# Patient Record
Sex: Female | Born: 1985 | Race: Black or African American | Hispanic: No | Marital: Single | Smoking: Current every day smoker
Health system: Southern US, Community
[De-identification: ages and names within clinical notes are randomized; demographics above are authoritative.]

## PROBLEM LIST (undated history)

## (undated) ENCOUNTER — Inpatient Hospital Stay (HOSPITAL_COMMUNITY): Payer: Self-pay

## (undated) DIAGNOSIS — R87629 Unspecified abnormal cytological findings in specimens from vagina: Secondary | ICD-10-CM

## (undated) DIAGNOSIS — A749 Chlamydial infection, unspecified: Secondary | ICD-10-CM

## (undated) DIAGNOSIS — R519 Headache, unspecified: Secondary | ICD-10-CM

## (undated) DIAGNOSIS — R51 Headache: Secondary | ICD-10-CM

## (undated) DIAGNOSIS — IMO0002 Reserved for concepts with insufficient information to code with codable children: Secondary | ICD-10-CM

## (undated) DIAGNOSIS — A549 Gonococcal infection, unspecified: Secondary | ICD-10-CM

## (undated) HISTORY — PX: COLPOSCOPY: SHX161

## (undated) HISTORY — PX: DILATION AND CURETTAGE OF UTERUS: SHX78

## (undated) HISTORY — PX: THERAPEUTIC ABORTION: SHX798

---

## 2006-09-30 ENCOUNTER — Emergency Department (HOSPITAL_COMMUNITY): Admission: EM | Admit: 2006-09-30 | Discharge: 2006-09-30 | Payer: Self-pay | Admitting: Emergency Medicine

## 2007-08-14 ENCOUNTER — Emergency Department (HOSPITAL_COMMUNITY): Admission: EM | Admit: 2007-08-14 | Discharge: 2007-08-14 | Payer: Self-pay | Admitting: Emergency Medicine

## 2007-12-31 ENCOUNTER — Emergency Department (HOSPITAL_COMMUNITY): Admission: EM | Admit: 2007-12-31 | Discharge: 2008-01-01 | Payer: Self-pay | Admitting: Emergency Medicine

## 2008-01-15 ENCOUNTER — Emergency Department (HOSPITAL_COMMUNITY): Admission: EM | Admit: 2008-01-15 | Discharge: 2008-01-16 | Payer: Self-pay | Admitting: Emergency Medicine

## 2008-02-11 ENCOUNTER — Inpatient Hospital Stay (HOSPITAL_COMMUNITY): Admission: AD | Admit: 2008-02-11 | Discharge: 2008-02-11 | Payer: Self-pay | Admitting: Obstetrics & Gynecology

## 2008-03-28 ENCOUNTER — Inpatient Hospital Stay (HOSPITAL_COMMUNITY): Admission: AD | Admit: 2008-03-28 | Discharge: 2008-03-28 | Payer: Self-pay | Admitting: Obstetrics & Gynecology

## 2008-04-02 ENCOUNTER — Inpatient Hospital Stay (HOSPITAL_COMMUNITY): Admission: AD | Admit: 2008-04-02 | Discharge: 2008-04-02 | Payer: Self-pay | Admitting: Obstetrics & Gynecology

## 2009-03-07 ENCOUNTER — Emergency Department (HOSPITAL_COMMUNITY): Admission: EM | Admit: 2009-03-07 | Discharge: 2009-03-07 | Payer: Self-pay | Admitting: Family Medicine

## 2009-03-21 ENCOUNTER — Emergency Department (HOSPITAL_COMMUNITY): Admission: EM | Admit: 2009-03-21 | Discharge: 2009-03-21 | Payer: Self-pay | Admitting: Emergency Medicine

## 2009-04-09 ENCOUNTER — Emergency Department (HOSPITAL_COMMUNITY): Admission: EM | Admit: 2009-04-09 | Discharge: 2009-04-09 | Payer: Self-pay | Admitting: Family Medicine

## 2009-05-23 ENCOUNTER — Emergency Department (HOSPITAL_COMMUNITY): Admission: EM | Admit: 2009-05-23 | Discharge: 2009-05-23 | Payer: Self-pay | Admitting: Family Medicine

## 2009-09-16 ENCOUNTER — Ambulatory Visit: Payer: Self-pay | Admitting: Nurse Practitioner

## 2009-09-16 ENCOUNTER — Inpatient Hospital Stay (HOSPITAL_COMMUNITY): Admission: AD | Admit: 2009-09-16 | Discharge: 2009-09-16 | Payer: Self-pay | Admitting: Family Medicine

## 2009-10-08 ENCOUNTER — Emergency Department (HOSPITAL_COMMUNITY)
Admission: EM | Admit: 2009-10-08 | Discharge: 2009-10-09 | Payer: Self-pay | Source: Home / Self Care | Admitting: Emergency Medicine

## 2010-03-19 LAB — CBC
HCT: 34 % — ABNORMAL LOW (ref 36.0–46.0)
MCH: 31.9 pg (ref 26.0–34.0)
MCV: 92.1 fL (ref 78.0–100.0)
Platelets: 214 10*3/uL (ref 150–400)
RDW: 13 % (ref 11.5–15.5)

## 2010-03-19 LAB — POCT PREGNANCY, URINE: Preg Test, Ur: POSITIVE

## 2010-03-19 LAB — URINALYSIS, ROUTINE W REFLEX MICROSCOPIC
Ketones, ur: NEGATIVE mg/dL
Nitrite: NEGATIVE
Protein, ur: NEGATIVE mg/dL

## 2010-03-19 LAB — GC/CHLAMYDIA PROBE AMP, GENITAL
Chlamydia, DNA Probe: NEGATIVE
GC Probe Amp, Genital: NEGATIVE

## 2010-03-19 LAB — URINE CULTURE

## 2010-03-19 LAB — WET PREP, GENITAL: Trich, Wet Prep: NONE SEEN

## 2010-03-25 LAB — POCT PREGNANCY, URINE: Preg Test, Ur: NEGATIVE

## 2010-03-29 LAB — POCT URINALYSIS DIP (DEVICE)
Glucose, UA: NEGATIVE mg/dL
Ketones, ur: 40 mg/dL — AB
Nitrite: POSITIVE — AB
pH: 6 (ref 5.0–8.0)

## 2010-03-29 LAB — POCT PREGNANCY, URINE: Preg Test, Ur: NEGATIVE

## 2010-03-29 LAB — URINE CULTURE: Colony Count: >=100000

## 2010-04-16 LAB — CBC
HCT: 36.6 % (ref 36.0–46.0)
Hemoglobin: 12.2 g/dL (ref 12.0–15.0)
MCHC: 33.3 g/dL (ref 30.0–36.0)
MCHC: 33.6 g/dL (ref 30.0–36.0)
MCV: 91.7 fL (ref 78.0–100.0)
MCV: 93.7 fL (ref 78.0–100.0)
Platelets: 197 10*3/uL (ref 150–400)
RBC: 3.91 MIL/uL (ref 3.87–5.11)
RDW: 12.5 % (ref 11.5–15.5)
RDW: 13 % (ref 11.5–15.5)

## 2010-04-16 LAB — URINE MICROSCOPIC-ADD ON

## 2010-04-16 LAB — URINALYSIS, ROUTINE W REFLEX MICROSCOPIC
Glucose, UA: NEGATIVE mg/dL
Leukocytes, UA: NEGATIVE
Protein, ur: NEGATIVE mg/dL
Specific Gravity, Urine: 1.015 (ref 1.005–1.030)

## 2010-04-16 LAB — GC/CHLAMYDIA PROBE AMP, GENITAL: GC Probe Amp, Genital: NEGATIVE

## 2010-04-16 LAB — POCT PREGNANCY, URINE: Preg Test, Ur: NEGATIVE

## 2010-04-20 LAB — URINALYSIS, ROUTINE W REFLEX MICROSCOPIC
Bilirubin Urine: NEGATIVE
Hgb urine dipstick: NEGATIVE
Protein, ur: NEGATIVE mg/dL
Urobilinogen, UA: 1 mg/dL (ref 0.0–1.0)

## 2010-04-20 LAB — POCT I-STAT, CHEM 8
Calcium, Ion: 1.16 mmol/L (ref 1.12–1.32)
Creatinine, Ser: 0.7 mg/dL (ref 0.4–1.2)
Glucose, Bld: 87 mg/dL (ref 70–99)
HCT: 33 % — ABNORMAL LOW (ref 36.0–46.0)
Hemoglobin: 11.2 g/dL — ABNORMAL LOW (ref 12.0–15.0)
TCO2: 25 mmol/L (ref 0–100)

## 2010-04-20 LAB — HCG, QUANTITATIVE, PREGNANCY: hCG, Beta Chain, Quant, S: 130588 m[IU]/mL — ABNORMAL HIGH (ref <5)

## 2010-04-20 LAB — URINE MICROSCOPIC-ADD ON

## 2010-04-21 LAB — URINALYSIS, ROUTINE W REFLEX MICROSCOPIC
Hgb urine dipstick: NEGATIVE
Ketones, ur: NEGATIVE mg/dL
Nitrite: NEGATIVE
Protein, ur: NEGATIVE mg/dL
Specific Gravity, Urine: 1.025 (ref 1.005–1.030)
Urobilinogen, UA: 0.2 mg/dL (ref 0.0–1.0)

## 2010-04-21 LAB — CBC
HCT: 32.6 % — ABNORMAL LOW (ref 36.0–46.0)
MCHC: 33.2 g/dL (ref 30.0–36.0)
MCV: 94.2 fL (ref 78.0–100.0)
Platelets: 203 10*3/uL (ref 150–400)
WBC: 7.3 10*3/uL (ref 4.0–10.5)

## 2010-04-21 LAB — WET PREP, GENITAL

## 2010-04-21 LAB — GC/CHLAMYDIA PROBE AMP, GENITAL: Chlamydia, DNA Probe: NEGATIVE

## 2010-07-19 ENCOUNTER — Emergency Department (HOSPITAL_COMMUNITY)
Admission: EM | Admit: 2010-07-19 | Discharge: 2010-07-19 | Disposition: A | Discharge status: Home / Self Care | Payer: Medicaid Other | Attending: Emergency Medicine | Admitting: Emergency Medicine

## 2010-07-19 ENCOUNTER — Emergency Department (HOSPITAL_COMMUNITY): Payer: Medicaid Other

## 2010-07-19 DIAGNOSIS — G43909 Migraine, unspecified, not intractable, without status migrainosus: Secondary | ICD-10-CM | POA: Insufficient documentation

## 2010-07-19 DIAGNOSIS — K089 Disorder of teeth and supporting structures, unspecified: Secondary | ICD-10-CM | POA: Insufficient documentation

## 2010-07-19 DIAGNOSIS — R1110 Vomiting, unspecified: Secondary | ICD-10-CM | POA: Insufficient documentation

## 2010-07-19 DIAGNOSIS — D573 Sickle-cell trait: Secondary | ICD-10-CM | POA: Insufficient documentation

## 2010-08-16 ENCOUNTER — Emergency Department (HOSPITAL_COMMUNITY)
Admission: EM | Admit: 2010-08-16 | Discharge: 2010-08-16 | Disposition: A | Discharge status: Home / Self Care | Payer: Medicaid Other | Attending: Emergency Medicine | Admitting: Emergency Medicine

## 2010-08-16 DIAGNOSIS — N949 Unspecified condition associated with female genital organs and menstrual cycle: Secondary | ICD-10-CM | POA: Insufficient documentation

## 2010-08-16 DIAGNOSIS — R109 Unspecified abdominal pain: Secondary | ICD-10-CM | POA: Insufficient documentation

## 2010-08-16 DIAGNOSIS — R5381 Other malaise: Secondary | ICD-10-CM | POA: Insufficient documentation

## 2010-08-16 DIAGNOSIS — R5383 Other fatigue: Secondary | ICD-10-CM | POA: Insufficient documentation

## 2010-08-16 LAB — POCT I-STAT, CHEM 8
BUN: 8 mg/dL (ref 6–23)
Calcium, Ion: 1.25 mmol/L (ref 1.12–1.32)
Chloride: 102 meq/L (ref 96–112)
Creatinine, Ser: 0.7 mg/dL (ref 0.50–1.10)
Glucose, Bld: 85 mg/dL (ref 70–99)
HCT: 36 % (ref 36.0–46.0)
Hemoglobin: 12.2 g/dL (ref 12.0–15.0)
Potassium: 3.5 meq/L (ref 3.5–5.1)
Sodium: 141 meq/L (ref 135–145)
TCO2: 26 mmol/L (ref 0–100)

## 2010-08-16 LAB — DIFFERENTIAL
Eosinophils Relative: 1 % (ref 0–5)
Lymphocytes Relative: 39 % (ref 12–46)
Lymphs Abs: 3.1 10*3/uL (ref 0.7–4.0)
Monocytes Absolute: 0.7 10*3/uL (ref 0.1–1.0)

## 2010-08-16 LAB — CBC
HCT: 33 % — ABNORMAL LOW (ref 36.0–46.0)
MCHC: 35.5 g/dL (ref 30.0–36.0)
MCV: 85.5 fL (ref 78.0–100.0)
RDW: 12.5 % (ref 11.5–15.5)

## 2010-08-16 LAB — URINALYSIS, ROUTINE W REFLEX MICROSCOPIC
Bilirubin Urine: NEGATIVE
Nitrite: NEGATIVE
Protein, ur: NEGATIVE mg/dL
Urobilinogen, UA: 1 mg/dL (ref 0.0–1.0)

## 2010-08-16 LAB — POCT PREGNANCY, URINE: Preg Test, Ur: NEGATIVE

## 2010-08-16 LAB — URINE MICROSCOPIC-ADD ON

## 2010-10-09 LAB — URINALYSIS, ROUTINE W REFLEX MICROSCOPIC
Glucose, UA: NEGATIVE mg/dL
Protein, ur: NEGATIVE mg/dL
pH: 5.5 (ref 5.0–8.0)

## 2010-10-09 LAB — URINE MICROSCOPIC-ADD ON

## 2010-10-09 LAB — POCT PREGNANCY, URINE: Preg Test, Ur: POSITIVE

## 2010-10-15 LAB — POCT PREGNANCY, URINE
Operator id: 247071
Preg Test, Ur: NEGATIVE

## 2011-01-16 ENCOUNTER — Encounter (HOSPITAL_COMMUNITY): Payer: Self-pay | Admitting: *Deleted

## 2011-01-16 ENCOUNTER — Inpatient Hospital Stay (HOSPITAL_COMMUNITY)
Admission: AD | Admit: 2011-01-16 | Discharge: 2011-01-16 | Disposition: A | Discharge status: Home / Self Care | Payer: Medicaid Other | Source: Ambulatory Visit | Attending: Obstetrics & Gynecology | Admitting: Obstetrics & Gynecology

## 2011-01-16 DIAGNOSIS — A599 Trichomoniasis, unspecified: Secondary | ICD-10-CM

## 2011-01-16 DIAGNOSIS — R109 Unspecified abdominal pain: Secondary | ICD-10-CM | POA: Insufficient documentation

## 2011-01-16 DIAGNOSIS — A5901 Trichomonal vulvovaginitis: Secondary | ICD-10-CM | POA: Insufficient documentation

## 2011-01-16 HISTORY — DX: Chlamydial infection, unspecified: A74.9

## 2011-01-16 HISTORY — DX: Gonococcal infection, unspecified: A54.9

## 2011-01-16 HISTORY — DX: Reserved for concepts with insufficient information to code with codable children: IMO0002

## 2011-01-16 LAB — CBC
HCT: 32.7 % — ABNORMAL LOW (ref 36.0–46.0)
Hemoglobin: 11.7 g/dL — ABNORMAL LOW (ref 12.0–15.0)
MCHC: 35.8 g/dL (ref 30.0–36.0)
RBC: 3.77 MIL/uL — ABNORMAL LOW (ref 3.87–5.11)
WBC: 8.2 10*3/uL (ref 4.0–10.5)

## 2011-01-16 LAB — URINALYSIS, ROUTINE W REFLEX MICROSCOPIC
Bilirubin Urine: NEGATIVE
Ketones, ur: NEGATIVE mg/dL
Nitrite: NEGATIVE
Protein, ur: NEGATIVE mg/dL
Urobilinogen, UA: 0.2 mg/dL (ref 0.0–1.0)
pH: 6 (ref 5.0–8.0)

## 2011-01-16 LAB — URINE MICROSCOPIC-ADD ON

## 2011-01-16 LAB — WET PREP, GENITAL

## 2011-01-16 MED ORDER — ONDANSETRON 4 MG PO TBDP
4.0000 mg | ORAL_TABLET | Freq: Once | ORAL | Status: AC
  Administered 2011-01-16: 4 mg via ORAL
  Filled 2011-01-16: qty 1

## 2011-01-16 MED ORDER — METRONIDAZOLE 500 MG PO TABS: 500.0000 mg | ORAL_TABLET | Freq: Two times a day (BID) | ORAL | Status: DC

## 2011-01-16 NOTE — Progress Notes (Signed)
States periods in Nov and Dec were very light and brown, abnl for her.  Last period on 01/06/11 was very heavy.  Has felt like she may be pregnant due to n/v and tender breasts, but HPT neg.

## 2011-01-16 NOTE — ED Provider Notes (Signed)
History   Pt presents today c/o N&V, tender breasts and lower abd cramping. She states she thinks she could be pregnant. She has also noted "milk" coming from her both breasts if she squeezes them. She denies fever, vag dc, or irritation. She states her menses finished a couple of days ago.  Chief Complaint  Patient presents with  . Breast Pain  . Abdominal Pain  . Emesis   HPI  OB History    Grav Para Term Preterm Abortions TAB SAB Ect Mult Living   4 2 2  2  2   2       Past Medical History  Diagnosis Date  . Abnormal Pap smear   . Gonorrhea   . Chlamydia     Past Surgical History  Procedure Date  . Colposcopy     History reviewed. No pertinent family history.  History  Substance Use Topics  . Smoking status: Never Smoker   . Smokeless tobacco: Never Used  . Alcohol Use: No    Allergies: No Known Allergies  No prescriptions prior to admission    Review of Systems  Constitutional: Negative for fever.  Respiratory: Negative for cough, hemoptysis, sputum production, shortness of breath and wheezing.   Cardiovascular: Negative for chest pain and palpitations.  Gastrointestinal: Positive for nausea, vomiting and abdominal pain. Negative for diarrhea, constipation and blood in stool.  Genitourinary: Negative for dysuria, urgency, frequency, hematuria and flank pain.  Neurological: Negative for dizziness and headaches.  Psychiatric/Behavioral: Negative for depression and suicidal ideas.   Physical Exam   Blood pressure 99/65, pulse 63, temperature 99 F (37.2 C), temperature source Oral, resp. rate 16, height 4\' 11"  (1.499 m), weight 108 lb (48.988 kg), last menstrual period 01/06/2011.  Physical Exam  Nursing note and vitals reviewed. Constitutional: She is oriented to person, place, and time. She appears well-developed and well-nourished. No distress.  HENT:  Head: Normocephalic and atraumatic.  Eyes: EOM are normal. Pupils are equal, round, and reactive to  light.  GI: Soft. She exhibits no distension and no mass. There is no tenderness. There is no rebound and no guarding.  Genitourinary: No bleeding around the vagina. Vaginal discharge found.       Cervix Lg/closed. Uterus NL size and shape. No adnexal masses. Thin, white vag dc present.  Neurological: She is alert and oriented to person, place, and time.  Skin: Skin is warm and dry. She is not diaphoretic.  Psychiatric: She has a normal mood and affect. Her behavior is normal. Judgment and thought content normal.    MAU Course  Procedures  Wet prep done.  Results for orders placed during the hospital encounter of 01/16/11 (from the past 72 hour(s))  URINALYSIS, ROUTINE W REFLEX MICROSCOPIC     Status: Abnormal   Collection Time   01/16/11  6:50 PM      Component Value Range Comment   Color, Urine YELLOW  YELLOW     APPearance CLEAR  CLEAR     Specific Gravity, Urine 1.020  1.005 - 1.030     pH 6.0  5.0 - 8.0     Glucose, UA NEGATIVE  NEGATIVE (mg/dL)    Hgb urine dipstick NEGATIVE  NEGATIVE     Bilirubin Urine NEGATIVE  NEGATIVE     Ketones, ur NEGATIVE  NEGATIVE (mg/dL)    Protein, ur NEGATIVE  NEGATIVE (mg/dL)    Urobilinogen, UA 0.2  0.0 - 1.0 (mg/dL)    Nitrite NEGATIVE  NEGATIVE  Leukocytes, UA SMALL (*) NEGATIVE    URINE MICROSCOPIC-ADD ON     Status: Normal   Collection Time   01/16/11  6:50 PM      Component Value Range Comment   Squamous Epithelial / LPF RARE  RARE     WBC, UA 0-2  <3 (WBC/hpf)   POCT PREGNANCY, URINE     Status: Normal   Collection Time   01/16/11  6:53 PM      Component Value Range Comment   Preg Test, Ur NEGATIVE     WET PREP, GENITAL     Status: Abnormal   Collection Time   01/16/11  7:27 PM      Component Value Range Comment   Yeast, Wet Prep NONE SEEN  NONE SEEN     Trich, Wet Prep RARE (*) NONE SEEN     Clue Cells, Wet Prep FEW (*) NONE SEEN     WBC, Wet Prep HPF POC FEW (*) NONE SEEN  FEW BACTERIA SEEN  CBC     Status: Abnormal    Collection Time   01/16/11  7:29 PM      Component Value Range Comment   WBC 8.2  4.0 - 10.5 (K/uL)    RBC 3.77 (*) 3.87 - 5.11 (MIL/uL)    Hemoglobin 11.7 (*) 12.0 - 15.0 (g/dL)    HCT 16.1 (*) 09.6 - 46.0 (%)    MCV 86.7  78.0 - 100.0 (fL)    MCH 31.0  26.0 - 34.0 (pg)    MCHC 35.8  30.0 - 36.0 (g/dL)    RDW 04.5  40.9 - 81.1 (%)    Platelets 233  150 - 400 (K/uL)      Assessment and Plan  Trichomonas: discussed with pt at length. Will tx with Flagyl. Warned of antabuse reaction. GC/Chlamydia cultures pending.  Clinton Gallant. Shaqueena Mauceri III, DrHSc, MPAS, PA-C  01/16/2011, 7:29 PM   Henrietta Hoover, PA 01/16/11 1952

## 2011-01-16 NOTE — Progress Notes (Signed)
For past couple of weeks loss of appetite, breast tenderness with some mil, abdominal pain with vomiting.LMP 1/0/13

## 2011-01-18 LAB — GC/CHLAMYDIA PROBE AMP, URINE
Chlamydia, Swab/Urine, PCR: NEGATIVE
GC Probe Amp, Urine: NEGATIVE

## 2011-01-23 ENCOUNTER — Encounter (HOSPITAL_COMMUNITY): Payer: Self-pay

## 2011-01-23 ENCOUNTER — Inpatient Hospital Stay (HOSPITAL_COMMUNITY)
Admission: AD | Admit: 2011-01-23 | Discharge: 2011-01-23 | Disposition: A | Discharge status: Home / Self Care | Payer: Medicaid Other | Source: Ambulatory Visit | Attending: Obstetrics and Gynecology | Admitting: Obstetrics and Gynecology

## 2011-01-23 DIAGNOSIS — R112 Nausea with vomiting, unspecified: Secondary | ICD-10-CM | POA: Insufficient documentation

## 2011-01-23 LAB — CBC
MCH: 30.8 pg (ref 26.0–34.0)
MCV: 87.1 fL (ref 78.0–100.0)
Platelets: 218 10*3/uL (ref 150–400)
RBC: 3.73 MIL/uL — ABNORMAL LOW (ref 3.87–5.11)
RDW: 12.6 % (ref 11.5–15.5)
WBC: 8.1 10*3/uL (ref 4.0–10.5)

## 2011-01-23 LAB — AMYLASE: Amylase: 36 U/L (ref 0–105)

## 2011-01-23 LAB — URINALYSIS, ROUTINE W REFLEX MICROSCOPIC
Bilirubin Urine: NEGATIVE
Leukocytes, UA: NEGATIVE
Nitrite: NEGATIVE
Specific Gravity, Urine: 1.015 (ref 1.005–1.030)
Urobilinogen, UA: 0.2 mg/dL (ref 0.0–1.0)
pH: 6.5 (ref 5.0–8.0)

## 2011-01-23 LAB — COMPREHENSIVE METABOLIC PANEL
ALT: 14 U/L (ref 0–35)
AST: 19 U/L (ref 0–37)
Albumin: 3.8 g/dL (ref 3.5–5.2)
CO2: 27 mEq/L (ref 19–32)
Calcium: 9 mg/dL (ref 8.4–10.5)
Chloride: 102 mEq/L (ref 96–112)
Creatinine, Ser: 0.68 mg/dL (ref 0.50–1.10)
Sodium: 136 mEq/L (ref 135–145)

## 2011-01-23 LAB — HCG, QUANTITATIVE, PREGNANCY: hCG, Beta Chain, Quant, S: <1 m[IU]/mL (ref <5)

## 2011-01-23 MED ORDER — ONDANSETRON 8 MG PO TBDP
8.0000 mg | ORAL_TABLET | Freq: Once | ORAL | Status: AC
  Administered 2011-01-23: 8 mg via ORAL
  Filled 2011-01-23: qty 1

## 2011-01-23 MED ORDER — ONDANSETRON 8 MG PO TBDP: 8.0000 mg | ORAL_TABLET | Freq: Three times a day (TID) | ORAL | Status: AC | PRN

## 2011-01-23 NOTE — Progress Notes (Signed)
Patient is here with c/o nausea, headache and abdominal pain for 2 weeks. She states that no home remedy is working for the nausea. She states the nausea is her main concern. She is denies any vaginal bleeding, or discharge. She states that she had irregular on nov. And dec. She thinks that she may be pregnant and will like a serum pregnancy test.

## 2011-01-23 NOTE — ED Provider Notes (Signed)
History     Chief Complaint  Patient presents with  . Headache  . Nausea   HPI  Patient is here with c/o nausea, headache and lower mid pelvic abdominal pain for 2 weeks. Nausea is primary concern due to not being able to eat. She denies any vaginal bleeding, or discharge. She states that LMP was in early [place].  Denies fever, body aches, or chills.  Headache is in the frontal area.  Reports vomiting approximately 4x today.     Past Medical History  Diagnosis Date  . Abnormal Pap smear   . Gonorrhea   . Chlamydia     Past Surgical History  Procedure Date  . Colposcopy   . Dilation and curettage of uterus     tab x2    History reviewed. No pertinent family history.  History  Substance Use Topics  . Smoking status: Current Some Day Smoker    Types: Cigarettes  . Smokeless tobacco: Never Used  . Alcohol Use: No    Allergies: No Known Allergies  Prescriptions prior to admission  Medication Sig Dispense Refill  . metroNIDAZOLE (FLAGYL) 500 MG tablet Take 1 tablet (500 mg total) by mouth 2 (two) times daily.  14 tablet  0    Review of Systems  Constitutional: Negative.   Eyes: Negative.   Respiratory: Negative.   Cardiovascular: Negative.   Gastrointestinal: Positive for nausea, vomiting and abdominal pain. Negative for diarrhea, constipation and blood in stool.  Genitourinary: Negative.   Neurological: Positive for headaches.   Physical Exam   Blood pressure 98/57, pulse 58, temperature 98.9 F (37.2 C), temperature source Oral, resp. rate 20, height 4\' 10"  (1.473 m), weight 52.164 kg (115 lb), last menstrual period 01/06/2011.  Physical Exam  Constitutional: She is oriented to person, place, and time. She appears well-developed and well-nourished. No distress.  HENT:  Head: Normocephalic.  Neck: Normal range of motion. Neck supple.  Cardiovascular: Normal rate, regular rhythm and normal heart sounds.   Respiratory: Effort normal and breath sounds  normal.  GI: Soft. She exhibits no mass. There is tenderness (left sided, mild with palpation) in the left lower quadrant. There is no guarding.  Neurological: She is alert and oriented to person, place, and time. She has normal reflexes.  Skin: Skin is warm and dry.    MAU Course  Procedures  Results for orders placed during the hospital encounter of 01/23/11 (from the past 24 hour(s))  URINALYSIS, ROUTINE W REFLEX MICROSCOPIC     Status: Normal   Collection Time   01/23/11  7:18 PM      Component Value Range   Color, Urine YELLOW  YELLOW    APPearance CLEAR  CLEAR    Specific Gravity, Urine 1.015  1.005 - 1.030    pH 6.5  5.0 - 8.0    Glucose, UA NEGATIVE  NEGATIVE (mg/dL)   Hgb urine dipstick NEGATIVE  NEGATIVE    Bilirubin Urine NEGATIVE  NEGATIVE    Ketones, ur NEGATIVE  NEGATIVE (mg/dL)   Protein, ur NEGATIVE  NEGATIVE (mg/dL)   Urobilinogen, UA 0.2  0.0 - 1.0 (mg/dL)   Nitrite NEGATIVE  NEGATIVE    Leukocytes, UA NEGATIVE  NEGATIVE   POCT PREGNANCY, URINE     Status: Normal   Collection Time   01/23/11  7:32 PM      Component Value Range   Preg Test, Ur NEGATIVE    CBC     Status: Abnormal   Collection Time  01/23/11  8:47 PM      Component Value Range   WBC 8.1  4.0 - 10.5 (K/uL)   RBC 3.73 (*) 3.87 - 5.11 (MIL/uL)   Hemoglobin 11.5 (*) 12.0 - 15.0 (g/dL)   HCT 16.1 (*) 09.6 - 46.0 (%)   MCV 87.1  78.0 - 100.0 (fL)   MCH 30.8  26.0 - 34.0 (pg)   MCHC 35.4  30.0 - 36.0 (g/dL)   RDW 04.5  40.9 - 81.1 (%)   Platelets 218  150 - 400 (K/uL)  COMPREHENSIVE METABOLIC PANEL     Status: Normal   Collection Time   01/23/11  8:47 PM      Component Value Range   Sodium 136  135 - 145 (mEq/L)   Potassium 3.9  3.5 - 5.1 (mEq/L)   Chloride 102  96 - 112 (mEq/L)   CO2 27  19 - 32 (mEq/L)   Glucose, Bld 96  70 - 99 (mg/dL)   BUN 6  6 - 23 (mg/dL)   Creatinine, Ser 9.14  0.50 - 1.10 (mg/dL)   Calcium 9.0  8.4 - 78.2 (mg/dL)   Total Protein 6.5  6.0 - 8.3 (g/dL)   Albumin  3.8  3.5 - 5.2 (g/dL)   AST 19  0 - 37 (U/L)   ALT 14  0 - 35 (U/L)   Alkaline Phosphatase 67  39 - 117 (U/L)   Total Bilirubin 0.3  0.3 - 1.2 (mg/dL)   GFR calc non Af Amer >90  >90 (mL/min)   GFR calc Af Amer >90  >90 (mL/min)  LIPASE, BLOOD     Status: Normal   Collection Time   01/23/11  8:47 PM      Component Value Range   Lipase 20  11 - 59 (U/L)  AMYLASE     Status: Normal   Collection Time   01/23/11  8:47 PM      Component Value Range   Amylase 36  0 - 105 (U/L)  HCG, QUANTITATIVE, PREGNANCY     Status: Normal   Collection Time   01/23/11  8:47 PM      Component Value Range   hCG, Beta Chain, Quant, S <1  <5 (mIU/mL)   Pt reports improvement in symptoms after one dose of Zofran.  Assessment and Plan  Nausea and Vomiting in Adult  Plan: DC home Reassured pt nausea may be related to Flagyl RX Zofran F/U if no improvement in one week  [facility] 01/23/2011, 8:36 PM

## 2011-01-23 NOTE — Progress Notes (Signed)
Pt states, " I've had nausea vomiting  and a headache for over a week.I was here last Sat, and they told me to come back if I wasn't better."

## 2011-01-24 NOTE — ED Provider Notes (Signed)
Agree with above note.  Tracie Garcia 01/24/2011 7:17 AM

## 2011-02-15 ENCOUNTER — Emergency Department (INDEPENDENT_AMBULATORY_CARE_PROVIDER_SITE_OTHER)
Admission: EM | Admit: 2011-02-15 | Discharge: 2011-02-15 | Disposition: A | Discharge status: Home / Self Care | Payer: Medicaid Other | Source: Home / Self Care | Attending: Emergency Medicine | Admitting: Emergency Medicine

## 2011-02-15 ENCOUNTER — Encounter (HOSPITAL_COMMUNITY): Payer: Self-pay

## 2011-02-15 DIAGNOSIS — B9789 Other viral agents as the cause of diseases classified elsewhere: Secondary | ICD-10-CM

## 2011-02-15 DIAGNOSIS — B349 Viral infection, unspecified: Secondary | ICD-10-CM

## 2011-02-15 LAB — POCT PREGNANCY, URINE: Preg Test, Ur: NEGATIVE

## 2011-02-15 LAB — POCT URINALYSIS DIP (DEVICE)
Bilirubin Urine: NEGATIVE
Glucose, UA: NEGATIVE mg/dL
Hgb urine dipstick: NEGATIVE
Ketones, ur: 15 mg/dL — AB
Specific Gravity, Urine: 1.015 (ref 1.005–1.030)

## 2011-02-15 LAB — POCT I-STAT, CHEM 8
BUN: 4 mg/dL — ABNORMAL LOW (ref 6–23)
Calcium, Ion: 1.16 mmol/L (ref 1.12–1.32)
Chloride: 104 mEq/L (ref 96–112)
Creatinine, Ser: 0.9 mg/dL (ref 0.50–1.10)
TCO2: 24 mmol/L (ref 0–100)

## 2011-02-15 LAB — CBC
HCT: 33.7 % — ABNORMAL LOW (ref 36.0–46.0)
Hemoglobin: 12 g/dL (ref 12.0–15.0)
MCV: 86.2 fL (ref 78.0–100.0)
RBC: 3.91 MIL/uL (ref 3.87–5.11)
RDW: 12.5 % (ref 11.5–15.5)
WBC: 15.7 10*3/uL — ABNORMAL HIGH (ref 4.0–10.5)

## 2011-02-15 LAB — DIFFERENTIAL
Basophils Absolute: 0 10*3/uL (ref 0.0–0.1)
Eosinophils Relative: 0 % (ref 0–5)
Lymphocytes Relative: 7 % — ABNORMAL LOW (ref 12–46)
Lymphs Abs: 1 10*3/uL (ref 0.7–4.0)
Monocytes Absolute: 1.3 10*3/uL — ABNORMAL HIGH (ref 0.1–1.0)
Neutro Abs: 13.4 10*3/uL — ABNORMAL HIGH (ref 1.7–7.7)

## 2011-02-15 MED ORDER — ONDANSETRON 4 MG PO TBDP
4.0000 mg | ORAL_TABLET | Freq: Once | ORAL | Status: AC
  Administered 2011-02-15: 4 mg via ORAL

## 2011-02-15 MED ORDER — ACETAMINOPHEN 325 MG PO TABS
975.0000 mg | ORAL_TABLET | Freq: Once | ORAL | Status: AC
  Administered 2011-02-15: 975 mg via ORAL

## 2011-02-15 MED ORDER — GUAIFENESIN-CODEINE 100-10 MG/5ML PO SYRP: 5.0000 mL | ORAL_SOLUTION | Freq: Three times a day (TID) | ORAL | Status: AC | PRN

## 2011-02-15 MED ORDER — ONDANSETRON HCL 4 MG PO TABS: 4.0000 mg | ORAL_TABLET | Freq: Three times a day (TID) | ORAL | Status: AC | PRN

## 2011-02-15 MED ORDER — ACETAMINOPHEN 325 MG PO TABS
ORAL_TABLET | ORAL | Status: AC
  Filled 2011-02-15: qty 3

## 2011-02-15 MED ORDER — HYDROCODONE-ACETAMINOPHEN 5-325 MG PO TABS
ORAL_TABLET | ORAL | Status: AC
  Filled 2011-02-15: qty 1

## 2011-02-15 MED ORDER — GUAIFENESIN-CODEINE 100-10 MG/5ML PO SYRP: 5.0000 mL | ORAL_SOLUTION | Freq: Three times a day (TID) | ORAL | Status: DC | PRN

## 2011-02-15 MED ORDER — ONDANSETRON HCL 4 MG PO TABS: 4.0000 mg | ORAL_TABLET | Freq: Three times a day (TID) | ORAL | Status: DC | PRN

## 2011-02-15 MED ORDER — ONDANSETRON 4 MG PO TBDP
ORAL_TABLET | ORAL | Status: AC
  Filled 2011-02-15: qty 1

## 2011-02-15 MED ORDER — ACETAMINOPHEN 325 MG PO TABS
ORAL_TABLET | ORAL | Status: AC
  Filled 2011-02-15: qty 1

## 2011-02-15 MED ORDER — HYDROCODONE-ACETAMINOPHEN 5-325 MG PO TABS
1.0000 | ORAL_TABLET | Freq: Once | ORAL | Status: AC
  Administered 2011-02-15: 1 via ORAL

## 2011-02-15 NOTE — ED Notes (Signed)
Pt states she started with cough last pm.  States around 3 am today she woke up with n/v, headache, stomach ache and body aches.  Has not taken anything for headache or other sx.

## 2011-02-15 NOTE — ED Provider Notes (Signed)
History     CSN: [id number]  Arrival date & time 02/15/11  1047   First MD Initiated Contact with Patient 02/15/11 1137      Chief Complaint  Patient presents with  . Nausea  . Headache  . Emesis  . Cough    (Consider location/radiation/quality/duration/timing/severity/associated sxs/prior treatment) HPI Comments: Patient presents to urgent care to describing cough and stuffed up nose since last night woke up this morning around 3 AM nausea is and vomited with an ongoing headache and stomach cramps and generalized body aches. Mild cough and stomach pain. Denies any diarrhea does has not tried anything for her symptoms. No household contacts with similar symptoms and feels like she has "the flu"  Patient is a 26 y.o. female presenting with headaches, vomiting, and cough. The history is provided by the patient and the spouse.  Headache The primary symptoms include headaches, fever, nausea and vomiting. Primary symptoms do not include loss of consciousness, altered mental status, dizziness or speech change. The symptoms began 6 to 12 hours ago. The symptoms are worsening.  The headache is not associated with eye pain or neck stiffness.  Additional symptoms include pain and lower back pain. Additional symptoms do not include neck stiffness or hearing loss.  Emesis  Associated symptoms include abdominal pain, cough, a fever and headaches.  Cough Associated symptoms include headaches and rhinorrhea. Pertinent negatives include no shortness of breath.    Past Medical History  Diagnosis Date  . Abnormal Pap smear   . Gonorrhea   . Chlamydia     Past Surgical History  Procedure Date  . Colposcopy   . Dilation and curettage of uterus     tab x2    History reviewed. No pertinent family history.  History  Substance Use Topics  . Smoking status: Current Some Day Smoker    Types: Cigarettes  . Smokeless tobacco: Never Used  . Alcohol Use: No    OB History    Grav Para Term  Preterm Abortions TAB SAB Ect Mult Living   4 2 2  2 2    2       Review of Systems  Constitutional: Positive for fever, diaphoresis, activity change, appetite change and fatigue.  HENT: Positive for congestion and rhinorrhea. Negative for hearing loss, neck pain and neck stiffness.   Eyes: Negative for pain.  Respiratory: Positive for cough. Negative for shortness of breath.   Gastrointestinal: Positive for nausea, vomiting and abdominal pain.  Genitourinary: Negative for dysuria, frequency and pelvic pain.  Skin: Negative for rash.  Neurological: Positive for headaches. Negative for dizziness, speech change and loss of consciousness.  Psychiatric/Behavioral: Negative for altered mental status.    Allergies  Review of patient's allergies indicates no known allergies.  Home Medications  No current outpatient prescriptions on file.  BP 93/64  Pulse 92  Temp(Src) 100.4 F (38 C) (Oral)  Resp 22  SpO2 100%  LMP 01/25/2011  Physical Exam  Nursing note and vitals reviewed. Constitutional: She is oriented to person, place, and time. She appears well-developed.  Non-toxic appearance. She has a sickly appearance. She appears distressed.  HENT:  Head: Normocephalic.  Nose: Rhinorrhea present.  Mouth/Throat: Uvula is midline, oropharynx is clear and moist and mucous membranes are normal. No oropharyngeal exudate.  Eyes: Conjunctivae are normal. No scleral icterus.  Neck: Neck supple. No JVD present. No Kernig's sign noted. No thyromegaly present.  Cardiovascular: Normal rate.   No murmur heard. Pulmonary/Chest: Effort normal and breath sounds  normal. No respiratory distress. She has no decreased breath sounds. She has no wheezes. She has no rhonchi. She has no rales.  Abdominal: Soft. She exhibits no distension. There is no hepatosplenomegaly. There is tenderness in the epigastric area. There is no rigidity, no rebound, no guarding, no CVA tenderness, no tenderness at McBurney's point  and negative Murphy's sign.  Musculoskeletal: She exhibits no tenderness.  Lymphadenopathy:    She has no cervical adenopathy.  Neurological: She is alert and oriented to person, place, and time.  Skin: No rash noted. No erythema.    ED Course  Procedures (including critical care time)  Labs Reviewed  POCT URINALYSIS DIP (DEVICE) - Abnormal; Notable for the following:    Ketones, ur 15 (*)    pH 8.5 (*)    Urobilinogen, UA 2.0 (*)    All other components within normal limits  POCT PREGNANCY, URINE   No results found.   No diagnosis found.    MDM  URI wity N+V < 24 HOURS        Jimmie Molly, MD 02/15/11 1242

## 2011-02-15 NOTE — ED Notes (Signed)
Waiting for patient's ride.  Patient called over 30 min ago, still has not arrived.

## 2011-08-19 ENCOUNTER — Encounter (HOSPITAL_COMMUNITY): Payer: Self-pay

## 2011-08-19 ENCOUNTER — Emergency Department (HOSPITAL_COMMUNITY)
Admission: EM | Admit: 2011-08-19 | Discharge: 2011-08-19 | Disposition: A | Discharge status: Home / Self Care | Payer: Medicaid Other | Attending: Emergency Medicine | Admitting: Emergency Medicine

## 2011-08-19 DIAGNOSIS — R1110 Vomiting, unspecified: Secondary | ICD-10-CM | POA: Insufficient documentation

## 2011-08-19 DIAGNOSIS — T50905A Adverse effect of unspecified drugs, medicaments and biological substances, initial encounter: Secondary | ICD-10-CM

## 2011-08-19 DIAGNOSIS — R51 Headache: Secondary | ICD-10-CM | POA: Insufficient documentation

## 2011-08-19 DIAGNOSIS — T50995A Adverse effect of other drugs, medicaments and biological substances, initial encounter: Secondary | ICD-10-CM | POA: Insufficient documentation

## 2011-08-19 DIAGNOSIS — F17200 Nicotine dependence, unspecified, uncomplicated: Secondary | ICD-10-CM | POA: Insufficient documentation

## 2011-08-19 LAB — BASIC METABOLIC PANEL
CO2: 22 mEq/L (ref 19–32)
Calcium: 8.9 mg/dL (ref 8.4–10.5)
Creatinine, Ser: 0.66 mg/dL (ref 0.50–1.10)
GFR calc non Af Amer: >90 mL/min (ref >90)
Glucose, Bld: 122 mg/dL — ABNORMAL HIGH (ref 70–99)

## 2011-08-19 LAB — CBC WITH DIFFERENTIAL/PLATELET
Eosinophils Absolute: 0 10*3/uL (ref 0.0–0.7)
Eosinophils Relative: 1 % (ref 0–5)
HCT: 35.5 % — ABNORMAL LOW (ref 36.0–46.0)
Lymphocytes Relative: 34 % (ref 12–46)
Lymphs Abs: 3 10*3/uL (ref 0.7–4.0)
MCH: 30.4 pg (ref 26.0–34.0)
MCV: 85.7 fL (ref 78.0–100.0)
Monocytes Absolute: 1.2 10*3/uL — ABNORMAL HIGH (ref 0.1–1.0)
RBC: 4.14 MIL/uL (ref 3.87–5.11)
RDW: 12.3 % (ref 11.5–15.5)
WBC: 8.7 10*3/uL (ref 4.0–10.5)

## 2011-08-19 MED ORDER — SODIUM CHLORIDE 0.9 % IV BOLUS (SEPSIS)
1000.0000 mL | Freq: Once | INTRAVENOUS | Status: AC
  Administered 2011-08-19: 1000 mL via INTRAVENOUS

## 2011-08-19 MED ORDER — ONDANSETRON HCL 4 MG/2ML IJ SOLN
4.0000 mg | Freq: Once | INTRAMUSCULAR | Status: AC
  Administered 2011-08-19: 4 mg via INTRAVENOUS
  Filled 2011-08-19: qty 2

## 2011-08-19 MED ORDER — DIPHENHYDRAMINE HCL 50 MG/ML IJ SOLN
12.5000 mg | Freq: Once | INTRAMUSCULAR | Status: AC
  Administered 2011-08-19: 12.5 mg via INTRAVENOUS
  Filled 2011-08-19: qty 1

## 2011-08-19 MED ORDER — PROCHLORPERAZINE EDISYLATE 5 MG/ML IJ SOLN
10.0000 mg | Freq: Once | INTRAMUSCULAR | Status: AC
  Administered 2011-08-19: 10 mg via INTRAVENOUS
  Filled 2011-08-19: qty 2

## 2011-08-19 NOTE — ED Notes (Signed)
Patient moved to room D34

## 2011-08-19 NOTE — ED Notes (Signed)
Pt ambulated to restroom without assitance 

## 2011-08-19 NOTE — ED Notes (Signed)
Patient discharged home with written and verbal instructions.

## 2011-08-19 NOTE — ED Notes (Signed)
Pt presents with onset of nausea, vomiting after taking Omni Cleansing Gelcaps this morning, going for a jog.  Pt denies any abdominal pain, reports headache.  Pt reports taking medication before without any problems.

## 2011-08-19 NOTE — ED Notes (Signed)
Pt. Is on monitor bp,

## 2011-08-19 NOTE — ED Notes (Signed)
Patient is resting comfortably. 

## 2011-08-19 NOTE — ED Provider Notes (Signed)
History     CSN: [id number]  Arrival date & time 08/19/11  1054   First MD Initiated Contact with Patient 08/19/11 1132      Chief Complaint  Patient presents with  . Emesis    (Consider location/radiation/quality/duration/timing/severity/associated sxs/prior treatment) Patient is a 26 y.o. female presenting with vomiting. The history is provided by the patient.  Emesis  This is a new problem. The current episode started less than 1 hour ago. Episode frequency: "several times" The problem has been gradually worsening. The emesis has an appearance of stomach contents. There has been no fever. Associated symptoms include headaches. Pertinent negatives include no abdominal pain, no chills, no cough, no diarrhea, no fever and no URI. Risk factors: took an herbal vitamin.    Past Medical History  Diagnosis Date  . Abnormal Pap smear   . Gonorrhea   . Chlamydia     Past Surgical History  Procedure Date  . Colposcopy   . Dilation and curettage of uterus     tab x2    No family history on file.  History  Substance Use Topics  . Smoking status: Current Some Day Smoker    Types: Cigarettes  . Smokeless tobacco: Never Used  . Alcohol Use: No    OB History    Grav Para Term Preterm Abortions TAB SAB Ect Mult Living   4 2 2  2 2    2       Review of Systems  Constitutional: Negative for fever and chills.  HENT: Negative for neck pain.   Eyes: Positive for visual disturbance.  Respiratory: Negative for cough and shortness of breath.   Cardiovascular: Negative for chest pain and leg swelling.  Gastrointestinal: Positive for nausea and vomiting. Negative for abdominal pain, diarrhea and constipation.  Genitourinary: Negative for urgency, decreased urine volume, difficulty urinating and menstrual problem.  Musculoskeletal: Negative.   Skin: Negative.   Neurological: Positive for light-headedness and headaches. Negative for weakness and numbness.  Hematological: Negative.     Psychiatric/Behavioral: Negative.   All other systems reviewed and are negative.    Allergies  Review of patient's allergies indicates no known allergies.  Home Medications   Current Outpatient Rx  Name Route Sig Dispense Refill  . ACETAMINOPHEN 500 MG PO TABS Oral Take 500 mg by mouth every 6 (six) hours as needed. For pain.    Marland Kitchen OVER THE COUNTER MEDICATION Oral Take 1 capsule by mouth daily. OMNI BOWEL CLEANSE.      BP 119/60  Pulse 101  Temp 98.2 F (36.8 C) (Oral)  Resp 18  SpO2 100%  Physical Exam  Nursing note and vitals reviewed. Constitutional: She is oriented to person, place, and time. She appears well-developed and well-nourished. No distress.  HENT:  Head: Normocephalic and atraumatic.  Right Ear: External ear normal.  Left Ear: External ear normal.  Nose: Nose normal.  Mouth/Throat: Oropharynx is clear and moist.  Eyes: EOM are normal. Pupils are equal, round, and reactive to light. Right eye exhibits no discharge. Left eye exhibits no discharge.  Neck: Full passive range of motion without pain. Neck supple. No rigidity.  Cardiovascular: Normal rate, regular rhythm, normal heart sounds and intact distal pulses.   Pulmonary/Chest: Effort normal and breath sounds normal. No respiratory distress. She has no wheezes. She has no rales.  Abdominal: Soft. She exhibits no distension. There is no tenderness.  Musculoskeletal: She exhibits no edema.  Neurological: She is alert and oriented to person, place, and time.  She has normal strength. She displays normal reflexes. No cranial nerve deficit or sensory deficit. She exhibits normal muscle tone. GCS eye subscore is 4. GCS verbal subscore is 5. GCS motor subscore is 6.  Skin: Skin is warm and dry. She is not diaphoretic. No pallor.    ED Course  Procedures (including critical care time)  Labs Reviewed  CBC WITH DIFFERENTIAL - Abnormal; Notable for the following:    HCT 35.5 (*)     Monocytes Relative 14 (*)      Monocytes Absolute 1.2 (*)     All other components within normal limits  BASIC METABOLIC PANEL - Abnormal; Notable for the following:    Sodium 134 (*)     Potassium 3.1 (*)     Glucose, Bld 122 (*)     All other components within normal limits  POCT PREGNANCY, URINE   No results found.   1. Headache   2. Vomiting       MDM  26 yo female with acute onset of frontal dull headache, vomiting and nausea after approx 20 min after taking OTC herbal/vitamin that she's only taken once before. Symptoms improved substantially with zofran and headache cocktail, and patient feels her lightheadedness is resolved and headache is very minor. Ambulates well and neuro exam normal. Unlikely to be SAH, sinus venous thrombosis or meningitis. Seems to be from the pill she took this AM. Discussed strict return precautions, including return/worsening of headache, fevers, neck pain, neuro symptoms or vision changes. At this time do not feel she needs CT or LP       Pricilla Loveless, MD 08/19/11 [phone]

## 2011-08-23 NOTE — ED Provider Notes (Signed)
I saw and evaluated the patient, reviewed the resident's note and I agree with the findings and plan. Pt with HA and vomiting after taking medication.  Pt with no medical problems and normal exam.  After supportive care pt feeling much better.  No concern for IC pathology.       Gwyneth Sprout, MD 08/23/11 1331

## 2011-09-14 ENCOUNTER — Inpatient Hospital Stay (HOSPITAL_COMMUNITY)
Admission: AD | Admit: 2011-09-14 | Discharge: 2011-09-14 | Disposition: A | Discharge status: Home / Self Care | Payer: Medicaid Other | Source: Ambulatory Visit | Attending: Obstetrics & Gynecology | Admitting: Obstetrics & Gynecology

## 2011-09-14 ENCOUNTER — Encounter (HOSPITAL_COMMUNITY): Payer: Self-pay

## 2011-09-14 ENCOUNTER — Inpatient Hospital Stay (HOSPITAL_COMMUNITY): Payer: Medicaid Other

## 2011-09-14 DIAGNOSIS — O98819 Other maternal infectious and parasitic diseases complicating pregnancy, unspecified trimester: Secondary | ICD-10-CM | POA: Insufficient documentation

## 2011-09-14 DIAGNOSIS — A599 Trichomoniasis, unspecified: Secondary | ICD-10-CM

## 2011-09-14 DIAGNOSIS — N760 Acute vaginitis: Secondary | ICD-10-CM | POA: Insufficient documentation

## 2011-09-14 DIAGNOSIS — R109 Unspecified abdominal pain: Secondary | ICD-10-CM | POA: Insufficient documentation

## 2011-09-14 DIAGNOSIS — A499 Bacterial infection, unspecified: Secondary | ICD-10-CM | POA: Insufficient documentation

## 2011-09-14 DIAGNOSIS — O26899 Other specified pregnancy related conditions, unspecified trimester: Secondary | ICD-10-CM

## 2011-09-14 DIAGNOSIS — A5901 Trichomonal vulvovaginitis: Secondary | ICD-10-CM | POA: Insufficient documentation

## 2011-09-14 DIAGNOSIS — O2390 Unspecified genitourinary tract infection in pregnancy, unspecified trimester: Secondary | ICD-10-CM | POA: Insufficient documentation

## 2011-09-14 DIAGNOSIS — R51 Headache: Secondary | ICD-10-CM | POA: Insufficient documentation

## 2011-09-14 DIAGNOSIS — B9689 Other specified bacterial agents as the cause of diseases classified elsewhere: Secondary | ICD-10-CM

## 2011-09-14 LAB — CBC
HCT: 33 % — ABNORMAL LOW (ref 36.0–46.0)
Hemoglobin: 11.5 g/dL — ABNORMAL LOW (ref 12.0–15.0)
MCH: 30.4 pg (ref 26.0–34.0)
MCHC: 34.8 g/dL (ref 30.0–36.0)

## 2011-09-14 LAB — URINALYSIS, ROUTINE W REFLEX MICROSCOPIC
Bilirubin Urine: NEGATIVE
Glucose, UA: NEGATIVE mg/dL
Hgb urine dipstick: NEGATIVE
Ketones, ur: NEGATIVE mg/dL
Leukocytes, UA: NEGATIVE
Protein, ur: NEGATIVE mg/dL
pH: 6 (ref 5.0–8.0)

## 2011-09-14 MED ORDER — METRONIDAZOLE 500 MG PO TABS: 500.0000 mg | ORAL_TABLET | Freq: Two times a day (BID) | ORAL | Status: DC

## 2011-09-14 NOTE — MAU Note (Signed)
Patient is in with c/o back pain and abdominal pain and headache. Denies any vaginal bleeding or discharge. Ultrasound and bloodwork is done.

## 2011-09-14 NOTE — MAU Note (Signed)
Pt states has been feeling bloated, having side pain, low back pain, intermittent sharp pain on left side only x1week, denies uti s/s, no abnormal vaginal discharge. Keeps feeling cramping. LMP-08/08/2011

## 2011-09-14 NOTE — MAU Provider Note (Signed)
History     CSN: [id number]  Arrival date and time: 09/14/11 1141   First Provider Initiated Contact with Patient 09/14/11 1601      Chief Complaint  Patient presents with  . Abdominal Pain  . Headache   HPI  Pt is [redacted]w[redacted]d pregnant with LMP 08/08/2011 .  Pt has started in her back and then radiating for her abdomen.  The pain comes and goes.  She has not had pain since this morning. She had some cramping like premenstrual cramping.  She took Tylenol this morning with relief.  She denies constipation or diarrhea.  She denies vaginal discharge, itching or burning.  Past Medical History  Diagnosis Date  . Abnormal Pap smear   . Gonorrhea   . Chlamydia     Past Surgical History  Procedure Date  . Colposcopy   . Dilation and curettage of uterus     tab x2    History reviewed. No pertinent family history.  History  Substance Use Topics  . Smoking status: Current Some Day Smoker    Types: Cigarettes  . Smokeless tobacco: Never Used  . Alcohol Use: No    Allergies: No Known Allergies  Prescriptions prior to admission  Medication Sig Dispense Refill  . acetaminophen (TYLENOL) 500 MG tablet Take 500 mg by mouth every 6 (six) hours as needed. For pain.      . Chlorphen-Pseudoephed-APAP (THERAFLU FLU/COLD PO) Take 1 packet by mouth 2 (two) times daily as needed. For cold or flu symtoms        ROS Physical Exam   Blood pressure 107/65, pulse 69, temperature 97.9 F (36.6 C), temperature source Oral, resp. rate 18, height 4' 10.5" (1.486 m), weight 53.128 kg (117 lb 2 oz), last menstrual period 08/08/2011.  Physical Exam  Vitals reviewed. Constitutional: She is oriented to person, place, and time. She appears well-developed and well-nourished.  HENT:  Head: Normocephalic.  Eyes: Pupils are equal, round, and reactive to light.  Neck: Normal range of motion. Neck supple.  Cardiovascular: Normal rate.   Respiratory: Effort normal.  GI: Soft. She exhibits no distension.  There is no tenderness. There is no rebound and no guarding.  Genitourinary:       Mod amount of white frothy discharge in vault; cervix clean NT; uterus NSSC NT; without adnexal tenderness or palpable enlargemnt  Musculoskeletal: Normal range of motion.  Neurological: She is alert and oriented to person, place, and time.  Skin: Skin is warm and dry.  Psychiatric: She has a normal mood and affect.    MAU Course  Procedures  Results for orders placed during the hospital encounter of 09/14/11 (from the past 24 hour(s))  URINALYSIS, ROUTINE W REFLEX MICROSCOPIC     Status: Abnormal   Collection Time   09/14/11 12:45 PM      Component Value Range   Color, Urine YELLOW  YELLOW   APPearance CLEAR  CLEAR   Specific Gravity, Urine 1.015  1.005 - 1.030   pH 6.0  5.0 - 8.0   Glucose, UA NEGATIVE  NEGATIVE mg/dL   Hgb urine dipstick NEGATIVE  NEGATIVE   Bilirubin Urine NEGATIVE  NEGATIVE   Ketones, ur NEGATIVE  NEGATIVE mg/dL   Protein, ur NEGATIVE  NEGATIVE mg/dL   Urobilinogen, UA 2.0 (*) 0.0 - 1.0 mg/dL   Nitrite NEGATIVE  NEGATIVE   Leukocytes, UA NEGATIVE  NEGATIVE  POCT PREGNANCY, URINE     Status: Abnormal   Collection Time   09/14/11 12:54  PM      Component Value Range   Preg Test, Ur POSITIVE (*) NEGATIVE  HCG, QUANTITATIVE, PREGNANCY     Status: Abnormal   Collection Time   09/14/11  1:32 PM      Component Value Range   hCG, Beta Chain, Quant, S 10791 (*) <5 mIU/mL  CBC     Status: Abnormal   Collection Time   09/14/11  2:42 PM      Component Value Range   WBC 6.6  4.0 - 10.5 K/uL   RBC 3.78 (*) 3.87 - 5.11 MIL/uL   Hemoglobin 11.5 (*) 12.0 - 15.0 g/dL   HCT 40.9 (*) 81.1 - 91.4 %   MCV 87.3  78.0 - 100.0 fL   MCH 30.4  26.0 - 34.0 pg   MCHC 34.8  30.0 - 36.0 g/dL   RDW 78.2  95.6 - 21.3 %   Platelets 227  150 - 400 K/uL  WET PREP, GENITAL     Status: Abnormal   Collection Time   09/14/11  4:20 PM      Component Value Range   Yeast Wet Prep HPF POC NONE SEEN  NONE  SEEN   Trich, Wet Prep FEW (*) NONE SEEN   Clue Cells Wet Prep HPF POC MANY (*) NONE SEEN   WBC, Wet Prep HPF POC FEW (*) NONE SEEN     Assessment and Plan  Abdominal pain in pregnancy Trichomonas and bacterial vaginosis- Flagyl 500mg  BID for 7 days; partner to be treated F/u with OB care- confirmation of pregnancy letter  Pamelia Hoit 09/14/2011, 4:05 PM

## 2011-09-15 LAB — GC/CHLAMYDIA PROBE AMP, GENITAL
Chlamydia, DNA Probe: NEGATIVE
GC Probe Amp, Genital: NEGATIVE

## 2011-09-17 ENCOUNTER — Telehealth (HOSPITAL_COMMUNITY): Payer: Self-pay | Admitting: Gynecology

## 2011-09-17 ENCOUNTER — Other Ambulatory Visit: Payer: Self-pay | Admitting: Advanced Practice Midwife

## 2011-09-17 MED ORDER — METRONIDAZOLE 500 MG PO TABS: 500.0000 mg | ORAL_TABLET | Freq: Two times a day (BID) | ORAL | Status: AC

## 2011-09-17 NOTE — Telephone Encounter (Signed)
E-prescription by Wynelle Bourgeois, CNM  Dorathy Kinsman, CNM 09/17/2011 7:20 PM
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
## 2011-09-18 NOTE — Telephone Encounter (Signed)
[facility] called and [facility] another [facility] for Flagyl 500mg  one BID for 7 days#14 called to [facility] [phone] Also pt sounds very congested and pt states that she cannot sleep due to congestion Pt is also nauseated and no appetite- discussed taking Mucinex as well as saline sprays Claritin 10 mg one QD #30 RF prn Zofran 8mg  ODT #20 RF x 3 Phenergan 25mg  #20 RF x2 Called to [facility] as well.

## 2011-09-26 ENCOUNTER — Inpatient Hospital Stay (HOSPITAL_COMMUNITY)
Admission: AD | Admit: 2011-09-26 | Discharge: 2011-09-27 | Disposition: A | Discharge status: Home / Self Care | Payer: Medicaid Other | Source: Ambulatory Visit | Attending: Obstetrics and Gynecology | Admitting: Obstetrics and Gynecology

## 2011-09-26 ENCOUNTER — Encounter (HOSPITAL_COMMUNITY): Payer: Self-pay | Admitting: *Deleted

## 2011-09-26 DIAGNOSIS — O210 Mild hyperemesis gravidarum: Secondary | ICD-10-CM | POA: Insufficient documentation

## 2011-09-26 DIAGNOSIS — R1032 Left lower quadrant pain: Secondary | ICD-10-CM | POA: Insufficient documentation

## 2011-09-26 LAB — COMPREHENSIVE METABOLIC PANEL
ALT: 8 U/L (ref 0–35)
AST: 14 U/L (ref 0–37)
CO2: 24 mEq/L (ref 19–32)
Calcium: 9.5 mg/dL (ref 8.4–10.5)
Chloride: 97 mEq/L (ref 96–112)
GFR calc non Af Amer: >90 mL/min (ref >90)
Sodium: 133 mEq/L — ABNORMAL LOW (ref 135–145)

## 2011-09-26 LAB — URINALYSIS, ROUTINE W REFLEX MICROSCOPIC
Leukocytes, UA: NEGATIVE
Nitrite: NEGATIVE
Specific Gravity, Urine: 1.015 (ref 1.005–1.030)
Urobilinogen, UA: 0.2 mg/dL (ref 0.0–1.0)

## 2011-09-26 LAB — CBC
MCH: 30.5 pg (ref 26.0–34.0)
Platelets: 214 10*3/uL (ref 150–400)
RBC: 3.74 MIL/uL — ABNORMAL LOW (ref 3.87–5.11)
WBC: 8.7 10*3/uL (ref 4.0–10.5)

## 2011-09-26 MED ORDER — FAMOTIDINE IN NACL 20-0.9 MG/50ML-% IV SOLN
20.0000 mg | Freq: Once | INTRAVENOUS | Status: AC
  Administered 2011-09-26: 20 mg via INTRAVENOUS
  Filled 2011-09-26: qty 50

## 2011-09-26 MED ORDER — PROMETHAZINE HCL 25 MG/ML IJ SOLN
12.5000 mg | Freq: Once | INTRAMUSCULAR | Status: AC
  Administered 2011-09-26: 12.5 mg via INTRAVENOUS
  Filled 2011-09-26: qty 1

## 2011-09-26 MED ORDER — LACTATED RINGERS IV BOLUS (SEPSIS)
1000.0000 mL | Freq: Once | INTRAVENOUS | Status: AC
  Administered 2011-09-26: 1000 mL via INTRAVENOUS

## 2011-09-26 NOTE — MAU Note (Signed)
Pt 7wks G5 P2, presents to MAU reporting SOB.  Pt breathing rapidly.  Pt reports N/V x 1 wk, taking Zofran ODT.  Unable to keep anything down.

## 2011-09-26 NOTE — MAU Provider Note (Signed)
History     CSN: [id number]  Arrival date and time: 09/26/11 2249   First Provider Initiated Contact with Patient 09/26/11 2314      Chief Complaint  Patient presents with  . Shortness of Breath  . Emesis During Pregnancy   HPI Tracie Garcia is a 26 y.o. female @ [redacted]w[redacted]d gestation who presents to MAU with nausea and vomiting. The symptoms started 2 weeks ago. Associated symptoms include abdominal pain, light headed, chills and feeling tired. Patient was here 2 weeks ago and given Rx for Zofran ODT but continues to vomit. States she hasn't kept anything down. Ate sausage biscuit and orange juice today and later vomited. On previous visit patient had complete evaluation with labs and ultrasound that showed a 5 week 2 day IUGS with a YS.  The history was provided by the patient and her medical record.  OB History    Grav Para Term Preterm Abortions TAB SAB Ect Mult Living   5 2 2  2 2    2       Past Medical History  Diagnosis Date  . Abnormal Pap smear   . Gonorrhea   . Chlamydia     Past Surgical History  Procedure Date  . Colposcopy   . Dilation and curettage of uterus     tab x2    History reviewed. No pertinent family history.  History  Substance Use Topics  . Smoking status: Current Some Day Smoker    Types: Cigarettes  . Smokeless tobacco: Never Used  . Alcohol Use: No    Allergies: No Known Allergies  Prescriptions prior to admission  Medication Sig Dispense Refill  . acetaminophen (TYLENOL) 500 MG tablet Take 500 mg by mouth every 6 (six) hours as needed. For pain.      . Chlorphen-Pseudoephed-APAP (THERAFLU FLU/COLD PO) Take 1 packet by mouth 2 (two) times daily as needed. For cold or flu symtoms      . metroNIDAZOLE (FLAGYL) 500 MG tablet Take 1 tablet (500 mg total) by mouth 2 (two) times daily.  14 tablet  0  . ondansetron (ZOFRAN-ODT) 8 MG disintegrating tablet Take 8 mg by mouth every 8 (eight) hours as needed.        Review of Systems    Constitutional: Positive for chills and malaise/fatigue. Negative for fever and weight loss.  HENT: Positive for sore throat (from vomiting). Negative for ear pain, nosebleeds, congestion and neck pain.   Eyes: Negative for blurred vision, double vision, photophobia and pain.  Respiratory: Negative for cough, shortness of breath and wheezing.   Cardiovascular: Negative for chest pain, palpitations and leg swelling.  Gastrointestinal: Positive for heartburn, nausea, vomiting and abdominal pain. Negative for diarrhea and constipation.  Genitourinary: Negative for dysuria, urgency and frequency.  Musculoskeletal: Negative for myalgias and back pain.  Skin: Negative for itching and rash.  Neurological: Positive for dizziness and headaches. Negative for sensory change, speech change, seizures and weakness.  Endo/Heme/Allergies: Does not bruise/bleed easily.  Psychiatric/Behavioral: Negative for depression. The patient is not nervous/anxious.    Physical Exam   Blood pressure 96/55, pulse 76, temperature 97.9 F (36.6 C), temperature source Oral, resp. rate 22, height 4\' 10"  (1.473 m), weight 121 lb (54.885 kg), last menstrual period 08/08/2011.  Physical Exam  Nursing note and vitals reviewed. Constitutional: She is oriented to person, place, and time. She appears well-developed and well-nourished. No distress.  HENT:  Head: Normocephalic and atraumatic.  Eyes: EOM are normal.  Neck: Neck  supple.  Cardiovascular: Normal rate.   Respiratory: Effort normal.  GI: Soft. There is tenderness in the left lower quadrant. There is no rigidity, no rebound, no guarding and no CVA tenderness.       Tenderness is mild  Musculoskeletal: Normal range of motion.  Neurological: She is alert and oriented to person, place, and time.  Skin: Skin is warm and dry.  Psychiatric: She has a normal mood and affect. Her behavior is normal. Judgment and thought content normal.   Results for orders placed during  the hospital encounter of 09/26/11 (from the past 24 hour(s))  URINALYSIS, ROUTINE W REFLEX MICROSCOPIC     Status: Normal   Collection Time   09/26/11 11:10 PM      Component Value Range   Color, Urine YELLOW  YELLOW   APPearance CLEAR  CLEAR   Specific Gravity, Urine 1.015  1.005 - 1.030   pH 6.5  5.0 - 8.0   Glucose, UA NEGATIVE  NEGATIVE mg/dL   Hgb urine dipstick NEGATIVE  NEGATIVE   Bilirubin Urine NEGATIVE  NEGATIVE   Ketones, ur NEGATIVE  NEGATIVE mg/dL   Protein, ur NEGATIVE  NEGATIVE mg/dL   Urobilinogen, UA 0.2  0.0 - 1.0 mg/dL   Nitrite NEGATIVE  NEGATIVE   Leukocytes, UA NEGATIVE  NEGATIVE  CBC     Status: Abnormal   Collection Time   09/26/11 11:13 PM      Component Value Range   WBC 8.7  4.0 - 10.5 K/uL   RBC 3.74 (*) 3.87 - 5.11 MIL/uL   Hemoglobin 11.4 (*) 12.0 - 15.0 g/dL   HCT 16.1 (*) 09.6 - 04.5 %   MCV 85.3  78.0 - 100.0 fL   MCH 30.5  26.0 - 34.0 pg   MCHC 35.7  30.0 - 36.0 g/dL   RDW 40.9  81.1 - 91.4 %   Platelets 214  150 - 400 K/uL  COMPREHENSIVE METABOLIC PANEL     Status: Abnormal   Collection Time   09/26/11 11:13 PM      Component Value Range   Sodium 133 (*) 135 - 145 mEq/L   Potassium 3.1 (*) 3.5 - 5.1 mEq/L   Chloride 97  96 - 112 mEq/L   CO2 24  19 - 32 mEq/L   Glucose, Bld 88  70 - 99 mg/dL   BUN 7  6 - 23 mg/dL   Creatinine, Ser 7.82  0.50 - 1.10 mg/dL   Calcium 9.5  8.4 - 95.6 mg/dL   Total Protein 7.0  6.0 - 8.3 g/dL   Albumin 4.1  3.5 - 5.2 g/dL   AST 14  0 - 37 U/L   ALT 8  0 - 35 U/L   Alkaline Phosphatase 52  39 - 117 U/L   Total Bilirubin 0.3  0.3 - 1.2 mg/dL   GFR calc non Af Amer >90  >90 mL/min   GFR calc Af Amer >90  >90 mL/min   Discussed with the patient hyperemesis diet and all questioned fully answered. She will start her prenatal care and return here if any problems arise.  Assessment:  26 y.o. female @ 110w0d gestation with nausea and vomiting   LLQ abdominal pain  Plan:  IV hydration   Phenergan IV   B.R.A.T  diet    Medication List     As of 09/27/2011 12:30 AM    START taking these medications         * ondansetron 8  MG disintegrating tablet   Commonly known as: ZOFRAN-ODT   Take 1 tablet (8 mg total) by mouth every 12 (twelve) hours as needed for nausea.      promethazine 25 MG suppository   Commonly known as: PHENERGAN   Place 1 suppository (25 mg total) rectally every 6 (six) hours as needed for nausea.      ranitidine 150 MG tablet   Commonly known as: ZANTAC   Take 1 tablet (150 mg total) by mouth 2 (two) times daily.     * Notice: This list has 1 medication(s) that are the same as other medications prescribed for you. Read the directions carefully, and ask your doctor or other care provider to review them with you.    CONTINUE taking these medications         acetaminophen 500 MG tablet   Commonly known as: TYLENOL      metroNIDAZOLE 500 MG tablet   Commonly known as: FLAGYL   Take 1 tablet (500 mg total) by mouth 2 (two) times daily.      * ondansetron 8 MG disintegrating tablet   Commonly known as: ZOFRAN-ODT     * Notice: This list has 1 medication(s) that are the same as other medications prescribed for you. Read the directions carefully, and ask your doctor or other care provider to review them with you.    STOP taking these medications         THERAFLU FLU/COLD PO          Where to get your medications    These are the prescriptions that you need to pick up.   You may get these medications from any pharmacy.         ondansetron 8 MG disintegrating tablet   promethazine 25 MG suppository   ranitidine 150 MG tablet           MAU Course  Procedures Informal bedside ultrasound shows an IUP with cardiac activity. Patient and partner able to visualize heart beat.   Alaney Witter, RN, FNP, [facility] 09/26/2011, 11:18 PM

## 2011-09-27 MED ORDER — PROMETHAZINE HCL 25 MG RE SUPP: 25.0000 mg | Freq: Four times a day (QID) | RECTAL | Status: DC | PRN

## 2011-09-27 MED ORDER — RANITIDINE HCL 150 MG PO TABS: 150.0000 mg | ORAL_TABLET | Freq: Two times a day (BID) | ORAL | Status: DC

## 2011-09-27 MED ORDER — ONDANSETRON 8 MG PO TBDP: 8.0000 mg | ORAL_TABLET | Freq: Two times a day (BID) | ORAL | Status: DC | PRN

## 2011-10-05 NOTE — MAU Provider Note (Signed)
Attestation of Attending Supervision of Advanced Practitioner: Evaluation and management procedures were performed by the PA/NP/CNM/OB Fellow under my supervision/collaboration. Chart reviewed and agree with management and plan.  Nathalia Wismer V 10/05/2011 3:36 PM

## 2011-10-19 ENCOUNTER — Encounter (HOSPITAL_COMMUNITY): Payer: Self-pay | Admitting: *Deleted

## 2011-10-19 ENCOUNTER — Emergency Department (INDEPENDENT_AMBULATORY_CARE_PROVIDER_SITE_OTHER)
Admission: EM | Admit: 2011-10-19 | Discharge: 2011-10-19 | Disposition: A | Discharge status: Home / Self Care | Payer: Medicaid Other | Source: Home / Self Care | Attending: Emergency Medicine | Admitting: Emergency Medicine

## 2011-10-19 DIAGNOSIS — R109 Unspecified abdominal pain: Secondary | ICD-10-CM

## 2011-10-19 LAB — WET PREP, GENITAL

## 2011-10-19 MED ORDER — IBUPROFEN 800 MG PO TABS: 800.0000 mg | ORAL_TABLET | Freq: Three times a day (TID) | ORAL | Status: DC

## 2011-10-19 MED ORDER — TRAMADOL HCL 50 MG PO TABS: 50.0000 mg | ORAL_TABLET | Freq: Four times a day (QID) | ORAL | Status: DC | PRN

## 2011-10-19 NOTE — ED Notes (Signed)
C/o abdominal cramping all week ( since Thur.) and spotting S/P abortion 3 weeks ago.

## 2011-10-19 NOTE — ED Provider Notes (Signed)
Medical screening examination/treatment/procedure(s) were performed by a resident physician and as supervising physician I was immediately available for consultation/collaboration.  Additionally, I saw the patient independently, verified the history, examined the patient and discussed the treatment plan with the resident. It appears that this is just typical post abortion pain and bleeding. Her abdomen is benign, and there are no worrisome symptoms. Her pregnancy test is still positive, but this is not unusual, 2-3 weeks out from an abortion.  Leslee Home, M.D.   Reuben Likes, MD 10/19/11 2150

## 2011-10-19 NOTE — ED Provider Notes (Signed)
History     CSN: [id number]  Arrival date & time 10/19/11  1839   First MD Initiated Contact with Patient 10/19/11 1846      Chief Complaint  Patient presents with  . Abdominal Cramping    (Consider location/radiation/quality/duration/timing/severity/associated sxs/prior treatment) HPI Elective abortion 3 weeks ago.  Had heavy bleeding for 1 week then lightened.  Now just spotting.  Has also continued to have abd cramping that is not responding to tylenol.  It is sometimes causing problems with sleep at night.  No fevers, chills, nausea, vomiting, diarrhea, dysuria.  Patient is not regularly taking NSAIDs.  No upper abd pain and no decreased appetite.  Past Medical History  Diagnosis Date  . Abnormal Pap smear   . Gonorrhea   . Chlamydia     Past Surgical History  Procedure Date  . Colposcopy   . Dilation and curettage of uterus     tab x2  . Therapeutic abortion     Family History  Problem Relation Age of Onset  . Adopted: Yes    History  Substance Use Topics  . Smoking status: Current Some Day Smoker -- 1.0 packs/day    Types: Cigarettes  . Smokeless tobacco: Never Used  . Alcohol Use: No    OB History    Grav Para Term Preterm Abortions TAB SAB Ect Mult Living   5 2 2  2 2    2       Review of Systems Per HPI  Allergies  Review of patient's allergies indicates no known allergies.  Home Medications   Current Outpatient Rx  Name Route Sig Dispense Refill  . ACETAMINOPHEN 500 MG PO TABS Oral Take 500 mg by mouth every 6 (six) hours as needed. For pain.    . IBUPROFEN 800 MG PO TABS Oral Take 1 tablet (800 mg total) by mouth 3 (three) times daily. 30 tablet 0  . ONDANSETRON 8 MG PO TBDP Oral Take 1 tablet (8 mg total) by mouth every 12 (twelve) hours as needed for nausea. 20 tablet 0  . ONDANSETRON 8 MG PO TBDP Oral Take 8 mg by mouth every 8 (eight) hours as needed.    Marland Kitchen PROMETHAZINE HCL 25 MG RE SUPP Rectal Place 1 suppository (25 mg total) rectally  every 6 (six) hours as needed for nausea. 12 each 0  . RANITIDINE HCL 150 MG PO TABS Oral Take 1 tablet (150 mg total) by mouth 2 (two) times daily. 60 tablet 0  . TRAMADOL HCL 50 MG PO TABS Oral Take 1 tablet (50 mg total) by mouth every 6 (six) hours as needed for pain. 10 tablet 0    LMP 08/08/2011  Breastfeeding? Unknown  Physical Exam Gen: NAD Abd: SNTND Pelvic exam: normal external genitalia, vulva, vagina, cervix, uterus and adnexa.  No CMT, no uterine tenderness.  No bleeding or significant discharge. Uterus is small and firm.   ED Course  Procedures (including critical care time)   Labs Reviewed  WET PREP, GENITAL  GC/CHLAMYDIA PROBE AMP, GENITAL   No results found.   1. Abdominal pain       MDM  Abd cramping and spotting following abortion with no signs of systemic infection or endometritis.  Patient is non-toxic appearing.  This appears to be a normal exam 3 weeks post abortion.  Will treat with NSAIDs and PRN tramadol and instructions to go to MAU or abortion provider if symptoms persist beyond 2-3 weeks from now.  Brent Bulla, MD 10/19/11 2107

## 2011-10-20 ENCOUNTER — Telehealth (HOSPITAL_COMMUNITY): Payer: Self-pay | Admitting: Emergency Medicine

## 2011-10-20 LAB — GC/CHLAMYDIA PROBE AMP, GENITAL: Chlamydia, DNA Probe: NEGATIVE

## 2011-10-20 MED ORDER — METRONIDAZOLE 500 MG PO TABS: 500.0000 mg | ORAL_TABLET | Freq: Two times a day (BID) | ORAL | Status: DC

## 2011-10-20 NOTE — Telephone Encounter (Signed)
Message copied by Reuben Likes on Wed Oct 20, 2011  4:35 PM ------      Message from: Vassie Moselle      Created: Wed Oct 20, 2011  4:10 PM       You co-signed this chart. I don't know when Dr. Louanne Belton will be here again. Do the clue cells need to be treated?      Vassie Moselle      10/20/2011

## 2011-10-20 NOTE — ED Notes (Signed)
The patient's wet prep was positive for clue cells. She was not treated for this during her visit last night.  Reuben Likes, MD 10/20/11 [phone]

## 2011-10-20 NOTE — ED Notes (Signed)
Patient concerned about extra Rx found in her bag when she went to pick her Rx . No documentation of this Rx exists in chart. After checking lab reports, have verified ID and discussed her negative findings, and will call pt back about her wet prep report. Encounter provider not present, so have reviewed reports with Dr Artis Flock, who would like for her to have flagyl 250 #21, take TID; patient states she already has this Rx, and has been advised to complete this Rx as written

## 2011-10-22 ENCOUNTER — Telehealth (HOSPITAL_COMMUNITY): Payer: Self-pay | Admitting: *Deleted

## 2011-10-22 NOTE — ED Notes (Signed)
GC/Chlamydia neg., Wet prep: Many clue cells, mod. WBC's.  Lab reviewed by Dr. Lorenz Coaster and he e-prescribed Flagyl to [facility] at [facility]  Pt. was notified yesterday of results and Rx.

## 2012-04-10 ENCOUNTER — Ambulatory Visit: Payer: Self-pay

## 2012-04-12 ENCOUNTER — Emergency Department (HOSPITAL_COMMUNITY): Payer: Medicaid Other

## 2012-04-12 ENCOUNTER — Emergency Department (HOSPITAL_COMMUNITY)
Admission: EM | Admit: 2012-04-12 | Discharge: 2012-04-13 | Disposition: A | Discharge status: Home / Self Care | Payer: Medicaid Other | Attending: Emergency Medicine | Admitting: Emergency Medicine

## 2012-04-12 ENCOUNTER — Encounter (HOSPITAL_COMMUNITY): Payer: Self-pay | Admitting: Emergency Medicine

## 2012-04-12 DIAGNOSIS — R209 Unspecified disturbances of skin sensation: Secondary | ICD-10-CM | POA: Insufficient documentation

## 2012-04-12 DIAGNOSIS — R5381 Other malaise: Secondary | ICD-10-CM | POA: Insufficient documentation

## 2012-04-12 DIAGNOSIS — M25531 Pain in right wrist: Secondary | ICD-10-CM

## 2012-04-12 DIAGNOSIS — Z8619 Personal history of other infectious and parasitic diseases: Secondary | ICD-10-CM | POA: Insufficient documentation

## 2012-04-12 DIAGNOSIS — M25539 Pain in unspecified wrist: Secondary | ICD-10-CM | POA: Insufficient documentation

## 2012-04-12 DIAGNOSIS — Z79899 Other long term (current) drug therapy: Secondary | ICD-10-CM | POA: Insufficient documentation

## 2012-04-12 DIAGNOSIS — R5383 Other fatigue: Secondary | ICD-10-CM | POA: Insufficient documentation

## 2012-04-12 DIAGNOSIS — F17200 Nicotine dependence, unspecified, uncomplicated: Secondary | ICD-10-CM | POA: Insufficient documentation

## 2012-04-12 NOTE — ED Notes (Signed)
C/o R forearm pain and swelling x 2 weeks.  No known injury.  States pain starts in R shoulder and goes down arm but majority of pain is at R forearm.

## 2012-04-13 LAB — D-DIMER, QUANTITATIVE: D-Dimer, Quant: <0.27 ug/mL-FEU (ref 0.00–0.48)

## 2012-04-13 LAB — CBC WITH DIFFERENTIAL/PLATELET
Basophils Absolute: 0 10*3/uL (ref 0.0–0.1)
HCT: 34.2 % — ABNORMAL LOW (ref 36.0–46.0)
Lymphocytes Relative: 46 % (ref 12–46)
Monocytes Absolute: 0.8 10*3/uL (ref 0.1–1.0)
Neutro Abs: 4.1 10*3/uL (ref 1.7–7.7)
Neutrophils Relative %: 44 % (ref 43–77)
Platelets: 264 10*3/uL (ref 150–400)
RDW: 12.7 % (ref 11.5–15.5)
WBC: 9.2 10*3/uL (ref 4.0–10.5)

## 2012-04-13 LAB — POCT I-STAT, CHEM 8
Chloride: 104 mEq/L (ref 96–112)
HCT: 37 % (ref 36.0–46.0)
Potassium: 3.5 mEq/L (ref 3.5–5.1)
Sodium: 141 mEq/L (ref 135–145)

## 2012-04-13 MED ORDER — HYDROCODONE-ACETAMINOPHEN 5-325 MG PO TABS: 2.0000 | ORAL_TABLET | Freq: Four times a day (QID) | ORAL | Status: DC | PRN

## 2012-04-13 NOTE — ED Provider Notes (Signed)
History     CSN: [id number]  Arrival date & time 04/12/12  2132   First MD Initiated Contact with Patient 04/12/12 2356      Chief Complaint  Patient presents with  . Arm Pain    (Consider location/radiation/quality/duration/timing/severity/associated sxs/prior treatment) HPI Comments: Reports intermittent L wrist swelling/pain Denies injury but has recently restarted using Depo injection. L hand dominant, does not work outside the house , 1 child age 26   Patient is a 27 y.o. female presenting with arm pain. The history is provided by the patient.  Arm Pain This is a recurrent problem. The current episode started more than 1 year ago. The problem occurs intermittently. The problem has been gradually worsening. Associated symptoms include joint swelling, numbness and weakness. Pertinent negatives include no chills, fever or rash. The symptoms are aggravated by exertion. She has tried nothing for the symptoms. The treatment provided no relief.    Past Medical History  Diagnosis Date  . Abnormal Pap smear   . Gonorrhea   . Chlamydia     Past Surgical History  Procedure Laterality Date  . Colposcopy    . Dilation and curettage of uterus      tab x2  . Therapeutic abortion      Family History  Problem Relation Age of Onset  . Adopted: Yes    History  Substance Use Topics  . Smoking status: Current Some Day Smoker -- 1.00 packs/day    Types: Cigarettes  . Smokeless tobacco: Never Used  . Alcohol Use: No    OB History   Grav Para Term Preterm Abortions TAB SAB Ect Mult Living   5 2 2  2 2    2       Review of Systems  Constitutional: Negative for fever, chills and activity change.  Musculoskeletal: Positive for joint swelling.  Skin: Negative for rash and wound.  Neurological: Positive for weakness and numbness.  All other systems reviewed and are negative.    Allergies  Review of patient's allergies indicates no known allergies.  Home Medications    Current Outpatient Rx  Name  Route  Sig  Dispense  Refill  . acetaminophen (TYLENOL) 500 MG tablet   Oral   Take 500 mg by mouth every 6 (six) hours as needed. For pain.         Marland Kitchen HYDROcodone-acetaminophen (NORCO/VICODIN) 5-325 MG per tablet   Oral   Take 2 tablets by mouth every 6 (six) hours as needed for pain.   30 tablet   0   . ibuprofen (ADVIL) 800 MG tablet   Oral   Take 1 tablet (800 mg total) by mouth 3 (three) times daily.   30 tablet   0   . metroNIDAZOLE (FLAGYL) 500 MG tablet   Oral   Take 1 tablet (500 mg total) by mouth 2 (two) times daily.   14 tablet   0   . ondansetron (ZOFRAN ODT) 8 MG disintegrating tablet   Oral   Take 1 tablet (8 mg total) by mouth every 12 (twelve) hours as needed for nausea.   20 tablet   0   . ondansetron (ZOFRAN-ODT) 8 MG disintegrating tablet   Oral   Take 8 mg by mouth every 8 (eight) hours as needed.         . promethazine (PHENERGAN) 25 MG suppository   Rectal   Place 1 suppository (25 mg total) rectally every 6 (six) hours as needed for nausea.   12  each   0   . ranitidine (ZANTAC) 150 MG tablet   Oral   Take 1 tablet (150 mg total) by mouth 2 (two) times daily.   60 tablet   0   . traMADol (ULTRAM) 50 MG tablet   Oral   Take 1 tablet (50 mg total) by mouth every 6 (six) hours as needed for pain.   10 tablet   0     BP 120/84  Pulse 74  Temp(Src) 98.1 F (36.7 C) (Oral)  Resp 20  SpO2 100%  LMP 08/08/2011  Physical Exam  Nursing note and vitals reviewed. Constitutional: She is oriented to person, place, and time. She appears well-developed and well-nourished.  HENT:  Head: Normocephalic.  Eyes: Pupils are equal, round, and reactive to light.  Neck: Normal range of motion.  Cardiovascular: Normal rate and regular rhythm.   Musculoskeletal: She exhibits tenderness. She exhibits no edema.       Right wrist: She exhibits decreased range of motion, tenderness and bony tenderness. She exhibits no  swelling, no effusion, no crepitus, no deformity and no laceration.  Pain with movement in all planes , grasp   Neurological: She is alert and oriented to person, place, and time.  Skin: Skin is warm.    ED Course  Procedures (including critical care time)  Labs Reviewed  CBC WITH DIFFERENTIAL - Abnormal; Notable for the following:    HCT 34.2 (*)    Lymphs Abs 4.2 (*)    All other components within normal limits  POCT I-STAT, CHEM 8 - Abnormal; Notable for the following:    BUN 4 (*)    All other components within normal limits  D-DIMER, QUANTITATIVE   Dg Forearm Right  04/12/2012  *RADIOLOGY REPORT*  Clinical Data: Distal right forearm pain, swelling and numbness.  RIGHT FOREARM - 2 VIEW  Comparison: None.  Findings: There is no evidence of fracture or dislocation.  The radius and ulna appear intact.  Negative ulnar variance is noted. The elbow joint is grossly unremarkable in appearance; no elbow joint effusion is identified.  No significant soft tissue abnormalities are characterized on radiograph.  No radiopaque foreign bodies are seen.  The carpal rows demonstrate normal alignment, and appear grossly intact.  Visualized joint spaces are preserved.  IMPRESSION: No evidence of fracture or dislocation.  No radiopaque foreign bodies seen.   Original Report Authenticated By: Tonia Ghent, M.D.      1. Wrist pain, right       MDM   Within the differential is vascular occlusion will obtain D dimer , CBC and I-stat        Arman Filter, NP 04/13/12 0130

## 2012-04-13 NOTE — ED Provider Notes (Signed)
Medical screening examination/treatment/procedure(s) were performed by non-physician practitioner and as supervising physician I was immediately available for consultation/collaboration.   David H Yao, MD 04/13/12 0602 

## 2012-04-13 NOTE — ED Notes (Signed)
Pt dc to home.   Pt states understanding to dc instructions.  Pt ambulatory to exit without difficulty.  Pt denies need for w/c. 

## 2012-04-13 NOTE — Progress Notes (Signed)
Orthopedic Tech Progress Note Patient Details:  Tracie Garcia 1985-08-22 [id number]  Ortho Devices Type of Ortho Device: Velcro wrist splint   Haskell Flirt 04/13/2012, 1:42 AM

## 2012-06-21 ENCOUNTER — Encounter (HOSPITAL_COMMUNITY): Payer: Self-pay

## 2012-06-21 ENCOUNTER — Inpatient Hospital Stay (HOSPITAL_COMMUNITY)
Admission: AD | Admit: 2012-06-21 | Discharge: 2012-06-21 | Disposition: A | Discharge status: Home / Self Care | Payer: Medicaid Other | Source: Ambulatory Visit | Attending: Obstetrics & Gynecology | Admitting: Obstetrics & Gynecology

## 2012-06-21 DIAGNOSIS — N912 Amenorrhea, unspecified: Secondary | ICD-10-CM | POA: Insufficient documentation

## 2012-06-21 DIAGNOSIS — N644 Mastodynia: Secondary | ICD-10-CM | POA: Insufficient documentation

## 2012-06-21 LAB — URINALYSIS, ROUTINE W REFLEX MICROSCOPIC
Bilirubin Urine: NEGATIVE
Glucose, UA: NEGATIVE mg/dL
Ketones, ur: NEGATIVE mg/dL
pH: 6 (ref 5.0–8.0)

## 2012-06-21 MED ORDER — MEDROXYPROGESTERONE ACETATE 150 MG/ML IM SUSP
150.0000 mg | Freq: Once | INTRAMUSCULAR | Status: AC
  Administered 2012-06-21: 150 mg via INTRAMUSCULAR
  Filled 2012-06-21: qty 1

## 2012-06-21 NOTE — MAU Note (Signed)
Patient will like further confirmation of whether she is pregnant or not. She states that her breast are fuller and tender. She states that she will like to go back on the depo provera if she is not pregnant. She denies vaginal bleeding, abnormal discharge or cramping. She wants blood pregnancy test.

## 2012-06-21 NOTE — MAU Provider Note (Signed)
History     CSN: [id number]  Arrival date and time: 06/21/12 1606   None     Chief Complaint  Patient presents with  . Possible Pregnancy   HPI 27 y.o. [id number] with breast pain and tenderness. Denies breast mass, nipple discharge, skin changes. Reports she missed Depo Provera shot in April, started bleeding right after that, no bleeding since. Patient's last menstrual period was 04/10/2012. Negative UPT at home. UPT negative here. Requests blood pregnancy test. States she would like to restart Depo Provera if not pregnant.   Past Medical History  Diagnosis Date  . Abnormal Pap smear   . Gonorrhea   . Chlamydia     Past Surgical History  Procedure Laterality Date  . Colposcopy    . Dilation and curettage of uterus      tab x2  . Therapeutic abortion      Family History  Problem Relation Age of Onset  . Adopted: Yes    History  Substance Use Topics  . Smoking status: Current Some Day Smoker -- 1.00 packs/day    Types: Cigarettes  . Smokeless tobacco: Never Used  . Alcohol Use: No    Allergies: No Known Allergies  Prescriptions prior to admission  Medication Sig Dispense Refill  . acetaminophen (TYLENOL) 500 MG tablet Take 500 mg by mouth every 6 (six) hours as needed. For pain.      Marland Kitchen HYDROcodone-acetaminophen (NORCO/VICODIN) 5-325 MG per tablet Take 2 tablets by mouth every 6 (six) hours as needed for pain.  30 tablet  0  . ibuprofen (ADVIL) 800 MG tablet Take 1 tablet (800 mg total) by mouth 3 (three) times daily.  30 tablet  0  . metroNIDAZOLE (FLAGYL) 500 MG tablet Take 1 tablet (500 mg total) by mouth 2 (two) times daily.  14 tablet  0  . ondansetron (ZOFRAN ODT) 8 MG disintegrating tablet Take 1 tablet (8 mg total) by mouth every 12 (twelve) hours as needed for nausea.  20 tablet  0  . ondansetron (ZOFRAN-ODT) 8 MG disintegrating tablet Take 8 mg by mouth every 8 (eight) hours as needed.      . promethazine (PHENERGAN) 25 MG suppository Place 1 suppository  (25 mg total) rectally every 6 (six) hours as needed for nausea.  12 each  0  . ranitidine (ZANTAC) 150 MG tablet Take 1 tablet (150 mg total) by mouth 2 (two) times daily.  60 tablet  0  . traMADol (ULTRAM) 50 MG tablet Take 1 tablet (50 mg total) by mouth every 6 (six) hours as needed for pain.  10 tablet  0    Review of Systems  Constitutional: Negative.   Respiratory: Negative.   Cardiovascular: Negative.   Gastrointestinal: Negative for nausea, vomiting, abdominal pain, diarrhea and constipation.  Genitourinary: Negative for dysuria, urgency, frequency, hematuria and flank pain.       Negative for vaginal bleeding, vaginal discharge, dyspareunia  Musculoskeletal: Negative.   Neurological: Negative.   Psychiatric/Behavioral: Negative.    Physical Exam   Blood pressure 117/74, pulse 66, temperature 98.7 F (37.1 C), temperature source Oral, resp. rate 16, height 4\' 11"  (1.499 m), weight 129 lb 6 oz (58.684 kg), last menstrual period 04/10/2012.  Physical Exam  Nursing note and vitals reviewed. Constitutional: She is oriented to person, place, and time. She appears well-developed and well-nourished. No distress.  Cardiovascular: Normal rate.   Respiratory: Effort normal.  Neurological: She is alert and oriented to person, place, and time.  Skin: Skin  is warm and dry.  Psychiatric: She has a normal mood and affect.    MAU Course  Procedures Results for orders placed during the hospital encounter of 06/21/12 (from the past 24 hour(s))  URINALYSIS, ROUTINE W REFLEX MICROSCOPIC     Status: Abnormal   Collection Time    06/21/12  4:51 PM      Result Value Range   Color, Urine YELLOW  YELLOW   APPearance CLEAR  CLEAR   Specific Gravity, Urine 1.025  1.005 - 1.030   pH 6.0  5.0 - 8.0   Glucose, UA NEGATIVE  NEGATIVE mg/dL   Hgb urine dipstick NEGATIVE  NEGATIVE   Bilirubin Urine NEGATIVE  NEGATIVE   Ketones, ur NEGATIVE  NEGATIVE mg/dL   Protein, ur NEGATIVE  NEGATIVE mg/dL    Urobilinogen, UA 2.0 (*) 0.0 - 1.0 mg/dL   Nitrite NEGATIVE  NEGATIVE   Leukocytes, UA NEGATIVE  NEGATIVE  POCT PREGNANCY, URINE     Status: None   Collection Time    06/21/12  5:46 PM      Result Value Range   Preg Test, Ur NEGATIVE  NEGATIVE    Assessment and Plan  Awaiting serum pregnancy test Plan to give Depo Provera tonight if negative Care assumed by Joseph Berkshire, PA  [facility] 06/21/2012, 6:10 PM   2000 - Assumed care from Georges Mouse, CNM  Results for orders placed during the hospital encounter of 06/21/12 (from the past 24 hour(s))  URINALYSIS, ROUTINE W REFLEX MICROSCOPIC     Status: Abnormal   Collection Time    06/21/12  4:51 PM      Result Value Range   Color, Urine YELLOW  YELLOW   APPearance CLEAR  CLEAR   Specific Gravity, Urine 1.025  1.005 - 1.030   pH 6.0  5.0 - 8.0   Glucose, UA NEGATIVE  NEGATIVE mg/dL   Hgb urine dipstick NEGATIVE  NEGATIVE   Bilirubin Urine NEGATIVE  NEGATIVE   Ketones, ur NEGATIVE  NEGATIVE mg/dL   Protein, ur NEGATIVE  NEGATIVE mg/dL   Urobilinogen, UA 2.0 (*) 0.0 - 1.0 mg/dL   Nitrite NEGATIVE  NEGATIVE   Leukocytes, UA NEGATIVE  NEGATIVE  POCT PREGNANCY, URINE     Status: None   Collection Time    06/21/12  5:46 PM      Result Value Range   Preg Test, Ur NEGATIVE  NEGATIVE  HCG, SERUM, QUALITATIVE     Status: None   Collection Time    06/21/12  6:07 PM      Result Value Range   Preg, Serum NEGATIVE  NEGATIVE     MDM Negative UPT and serum hCG today. 150 mg Depo Provera given in MAU  A: Amenorrhea secondary to Depo Provera  P: Discharge home Patient instructed to call [facility] for appointment for next Depo injection within the appropriate time frame Patient may return to MAU as needed  Freddi Starr, PA-C 06/21/2012 8:31 PM

## 2012-06-21 NOTE — MAU Note (Signed)
Pt states was on depo and should have rcvd injection 04/10/2012. Began having bleeding same day. Has taken home upt's that have all been negative. Denies bleeding or abnormal vaginal discharge. Breasts feel tender.

## 2012-07-16 ENCOUNTER — Encounter (HOSPITAL_COMMUNITY): Payer: Self-pay | Admitting: Emergency Medicine

## 2012-07-16 DIAGNOSIS — K644 Residual hemorrhoidal skin tags: Secondary | ICD-10-CM | POA: Insufficient documentation

## 2012-07-16 DIAGNOSIS — Z9889 Other specified postprocedural states: Secondary | ICD-10-CM | POA: Insufficient documentation

## 2012-07-16 DIAGNOSIS — Z8619 Personal history of other infectious and parasitic diseases: Secondary | ICD-10-CM | POA: Insufficient documentation

## 2012-07-16 DIAGNOSIS — F17200 Nicotine dependence, unspecified, uncomplicated: Secondary | ICD-10-CM | POA: Insufficient documentation

## 2012-07-16 NOTE — ED Notes (Signed)
PT. REPORTS HEMORRHOIDS PAIN ONSET TODAY DENIES BLEEDING , UNRELIEVED BY OTC CREAMS .

## 2012-07-17 ENCOUNTER — Emergency Department (HOSPITAL_COMMUNITY)
Admission: EM | Admit: 2012-07-17 | Discharge: 2012-07-17 | Disposition: A | Discharge status: Home / Self Care | Payer: Medicaid Other | Attending: Emergency Medicine | Admitting: Emergency Medicine

## 2012-07-17 DIAGNOSIS — K649 Unspecified hemorrhoids: Secondary | ICD-10-CM

## 2012-07-17 MED ORDER — POLYETHYLENE GLYCOL 3350 17 GM/SCOOP PO POWD: 17.0000 g | Freq: Every day | ORAL | Status: DC

## 2012-07-17 MED ORDER — HYDROCODONE-ACETAMINOPHEN 5-325 MG PO TABS: 1.0000 | ORAL_TABLET | ORAL | Status: DC | PRN

## 2012-07-17 MED ORDER — HYDROCODONE-ACETAMINOPHEN 5-325 MG PO TABS
1.0000 | ORAL_TABLET | Freq: Once | ORAL | Status: AC
  Administered 2012-07-17: 1 via ORAL
  Filled 2012-07-17: qty 1

## 2012-07-17 MED ORDER — LIDOCAINE HCL 2 % EX GEL
Freq: Once | CUTANEOUS | Status: DC
  Filled 2012-07-17: qty 20

## 2012-07-17 NOTE — ED Provider Notes (Signed)
History    CSN: [id number] Arrival date & time 07/16/12  2316  First MD Initiated Contact with Patient 07/17/12 0046     Chief Complaint  Patient presents with  . Hemorrhoids   HPI  History provided by the patient. Patient is a 27 year old female presenting with complaints of severe rectal pain and hemorrhoids. Patient reports having similar symptoms over a year ago from hemorrhoids and once today are similar. They began earlier in the day after a bowel movement. She states, but was soft without any semiconstrained. She has not had any recent constipation or diarrhea. Denies any other heavy lifting or straining that may have led to the hemorrhoids today. Pain has been persistent and very sharp and severe. She has taken Tylenol and prophylactic out any improvement. She also used warm soaks and preparation H. without any additional help with symptoms. She denies any aggravating or alleviating factors. No other associated symptoms. No abdominal pain, nausea or vomiting.   Past Medical History  Diagnosis Date  . Abnormal Pap smear   . Gonorrhea   . Chlamydia    Past Surgical History  Procedure Laterality Date  . Colposcopy    . Dilation and curettage of uterus      tab x2  . Therapeutic abortion     Family History  Problem Relation Age of Onset  . Adopted: Yes   History  Substance Use Topics  . Smoking status: Current Some Day Smoker -- 1.00 packs/day    Types: Cigarettes  . Smokeless tobacco: Never Used  . Alcohol Use: No   OB History   Grav Para Term Preterm Abortions TAB SAB Ect Mult Living   5 2 2  2 2    2      Review of Systems  Constitutional: Negative for fever, chills and diaphoresis.  Gastrointestinal: Positive for rectal pain. Negative for nausea, vomiting, abdominal pain, diarrhea, constipation and blood in stool.  All other systems reviewed and are negative.    Allergies  Review of patient's allergies indicates no known allergies.  Home Medications    Current Outpatient Rx  Name  Route  Sig  Dispense  Refill  . hydrocortisone cream 1 %   Topical   Apply 1 application topically 2 (two) times daily.         Marland Kitchen HYDROcodone-acetaminophen (NORCO) 5-325 MG per tablet   Oral   Take 1 tablet by mouth every 4 (four) hours as needed for pain.   5 tablet   0   . polyethylene glycol powder (GLYCOLAX/MIRALAX) powder   Oral   Take 17 g by mouth daily.   255 g   0    BP 123/73  Temp(Src) 98.1 F (36.7 C) (Oral)  Resp 18  SpO2 99%  LMP 04/10/2012 Physical Exam  Nursing note and vitals reviewed. Constitutional: She is oriented to person, place, and time. She appears well-developed and well-nourished. No distress.  HENT:  Head: Normocephalic.  Cardiovascular: Normal rate and regular rhythm.   Pulmonary/Chest: Effort normal and breath sounds normal. No respiratory distress. She has no wheezes.  Abdominal: Soft. There is no tenderness.  Genitourinary:     Chaperone was present. 2 large tender hemorrhoids the right anal area. No bleeding.  Musculoskeletal: Normal range of motion.  Neurological: She is alert and oriented to person, place, and time.  Skin: Skin is warm and dry. No rash noted.  Psychiatric: She has a normal mood and affect. Her behavior is normal.    ED  Course  Procedures   INCISION AND DRAINAGE Performed by: Angus Seller Consent: Verbal consent obtained. Risks and benefits: risks, benefits and alternatives were discussed Type: Hemorrhoid   Body area: Right inguinal area  Anesthesia: local infiltration  Incision was made with a scalpel.  Local anesthetic: lidocaine 2% with epinephrine  Anesthetic total: 2 ml  Complexity: Simple   Procedure: Nurse assisted with the procedure. Local anesthetic applied to 2 large hemorrhoids on the right anal area.  These were then incised with scalpel. Several large dark clots removed.  Patient tolerated the procedure well with no immediate complications or continued  bleeding.      1. Hemorrhoids     MDM  Patient seen and evaluated. Patient appears uncomfortable but no acute distress. Does report history of similar severely painful hemorrhoids over one year ago.  Angus Seller, PA-C 07/17/12 [phone]

## 2012-07-17 NOTE — ED Provider Notes (Signed)
Medical screening examination/treatment/procedure(s) were performed by non-physician practitioner and as supervising physician I was immediately available for consultation/collaboration.   Brandt Loosen, MD 07/17/12 [phone]

## 2012-07-20 ENCOUNTER — Ambulatory Visit (INDEPENDENT_AMBULATORY_CARE_PROVIDER_SITE_OTHER): Payer: Medicaid Other | Admitting: Surgery

## 2012-07-20 ENCOUNTER — Encounter (INDEPENDENT_AMBULATORY_CARE_PROVIDER_SITE_OTHER): Payer: Self-pay

## 2012-07-20 ENCOUNTER — Encounter (INDEPENDENT_AMBULATORY_CARE_PROVIDER_SITE_OTHER): Payer: Self-pay | Admitting: Surgery

## 2012-07-20 VITALS — BP 124/68 | HR 72 | Temp 97.9°F | Resp 15 | Ht 59.0 in | Wt 125.4 lb

## 2012-07-20 DIAGNOSIS — K645 Perianal venous thrombosis: Secondary | ICD-10-CM | POA: Insufficient documentation

## 2012-07-20 MED ORDER — HYDROCODONE-ACETAMINOPHEN 5-325 MG PO TABS: 1.0000 | ORAL_TABLET | ORAL | Status: DC | PRN

## 2012-07-20 NOTE — Progress Notes (Signed)
Subjective:     Patient ID: Tracie Garcia, female   DOB: 1985-06-19, 27 y.o.   MRN: [id number]  HPI Patient presents chief complaint perianal pain. She was seen 3 days emergency room thrombosed external hemorrhoid. This was incised. Her pain is not better. Her pain is actually worse. It is constant made worse with manipulation of the area. No drainage. Fever chills.  Review of Systems  Gastrointestinal: Positive for rectal pain.  Neurological: Negative.   Hematological: Negative.   Psychiatric/Behavioral: Negative.        Objective:   Physical Exam  Constitutional: She is oriented to person, place, and time. She appears well-developed and well-nourished.  Genitourinary:     Neurological: She is alert and oriented to person, place, and time.  Skin: Skin is warm and dry.  Psychiatric: She has a normal mood and affect. Her behavior is normal. Judgment and thought content normal.       Assessment:     Thrombosed external hemorrhoid right posterior    Plan:     Discussed options of treatment to include surgical excision versus medical management. The pros and cons of each were discussed. Complications of each were discussed. Surgical complications include the exclusive of bleeding, infection, recurrence, injury to neighboring structures. Medical management takes considerable time resolution of the pain. She was to proceed with excision. Under sterile conditions the PREPPED with Betadine. 1% lidocaine used for local anesthesia injected around the thrombosed hemorrhoid. Elliptical incision made to excise the complex with clot. Wound was left open. Hemostasis achieved. Dry dressing applied. Postoperative instructions given. Norco 5/325 #15 given for pain taken every 4 hours. High fiber diet sitz baths. Return if no improvement in the next 2-3 days.

## 2012-07-20 NOTE — Patient Instructions (Signed)
Soak in warm water / epsom salts.  Wear liner.  High fiber diet.  Out of work for 2- 3 days.

## 2012-08-03 ENCOUNTER — Ambulatory Visit (INDEPENDENT_AMBULATORY_CARE_PROVIDER_SITE_OTHER): Payer: Medicaid Other | Admitting: Surgery

## 2012-08-03 ENCOUNTER — Encounter (INDEPENDENT_AMBULATORY_CARE_PROVIDER_SITE_OTHER): Payer: Self-pay | Admitting: Surgery

## 2012-08-03 VITALS — BP 127/70 | HR 60 | Temp 98.0°F | Resp 14 | Ht <= 58 in | Wt 127.0 lb

## 2012-08-03 DIAGNOSIS — K644 Residual hemorrhoidal skin tags: Secondary | ICD-10-CM

## 2012-08-03 MED ORDER — HYDROCORTISONE 2.5 % RE CREA: TOPICAL_CREAM | Freq: Two times a day (BID) | RECTAL | Status: DC

## 2012-08-03 MED ORDER — LIDOCAINE 5 % EX OINT: TOPICAL_OINTMENT | CUTANEOUS | Status: DC | PRN

## 2012-08-03 MED ORDER — HYDROCODONE-ACETAMINOPHEN 5-325 MG PO TABS: 2.0000 | ORAL_TABLET | ORAL | Status: DC | PRN

## 2012-08-03 NOTE — Progress Notes (Signed)
Subjective:     Patient ID: Tracie Garcia, female   DOB: Mar 28, 1985, 27 y.o.   MRN: [id number]  HPI She is here for a followup visit. She had incision and drainage of a thrombosed hemorrhoid by Dr. Luisa Hart 2 weeks ago. She is here today because increasing burning discomfort. She has been doing sitz baths and her stools and then soft with the aid of stool softeners  Review of Systems     Objective:   Physical Exam  on exam, there is No further thrombosed hemorrhoid. There is just mild perianal inflammation and swelling     Assessment:     Inflamed hemorrhoidal tissue     Plan:     I will place her on Anusol and lidocaine cream. I will remove her Percocet. She will call back if she does not improve

## 2012-08-22 ENCOUNTER — Encounter (HOSPITAL_COMMUNITY): Payer: Self-pay | Admitting: Emergency Medicine

## 2012-08-22 ENCOUNTER — Emergency Department (HOSPITAL_COMMUNITY)
Admission: EM | Admit: 2012-08-22 | Discharge: 2012-08-22 | Disposition: A | Discharge status: Home / Self Care | Payer: Medicaid Other | Attending: Emergency Medicine | Admitting: Emergency Medicine

## 2012-08-22 DIAGNOSIS — R609 Edema, unspecified: Secondary | ICD-10-CM

## 2012-08-22 DIAGNOSIS — IMO0002 Reserved for concepts with insufficient information to code with codable children: Secondary | ICD-10-CM | POA: Insufficient documentation

## 2012-08-22 DIAGNOSIS — F17200 Nicotine dependence, unspecified, uncomplicated: Secondary | ICD-10-CM | POA: Insufficient documentation

## 2012-08-22 DIAGNOSIS — Z3202 Encounter for pregnancy test, result negative: Secondary | ICD-10-CM | POA: Insufficient documentation

## 2012-08-22 DIAGNOSIS — Z8619 Personal history of other infectious and parasitic diseases: Secondary | ICD-10-CM | POA: Insufficient documentation

## 2012-08-22 DIAGNOSIS — Z79899 Other long term (current) drug therapy: Secondary | ICD-10-CM | POA: Insufficient documentation

## 2012-08-22 LAB — URINALYSIS, ROUTINE W REFLEX MICROSCOPIC
Glucose, UA: NEGATIVE mg/dL
Specific Gravity, Urine: 1.013 (ref 1.005–1.030)
pH: 6 (ref 5.0–8.0)

## 2012-08-22 LAB — URINE MICROSCOPIC-ADD ON

## 2012-08-22 LAB — BASIC METABOLIC PANEL
CO2: 25 mEq/L (ref 19–32)
Chloride: 102 mEq/L (ref 96–112)
Sodium: 137 mEq/L (ref 135–145)

## 2012-08-22 LAB — POCT PREGNANCY, URINE: Preg Test, Ur: NEGATIVE

## 2012-08-22 NOTE — ED Provider Notes (Signed)
CSN: [id number]     Arrival date & time 08/22/12  1102 History     First MD Initiated Contact with Patient 08/22/12 1143     Chief Complaint  Patient presents with  . Foot Swelling   (Consider location/radiation/quality/duration/timing/severity/associated sxs/prior Treatment) HPI Comments: Patient presents emergency department with chief complaints of bilateral hand and foot swelling. She states that his been happening intermittently for the past 3 months. States it mostly happens when she was sitting down. She has not tried anything to alleviate her symptoms. She denies any dysuria. Denies any chest pain or shortness of breath. Is otherwise healthy.  The history is provided by the patient. No language interpreter was used.    Past Medical History  Diagnosis Date  . Abnormal Pap smear   . Gonorrhea   . Chlamydia    Past Surgical History  Procedure Laterality Date  . Colposcopy    . Dilation and curettage of uterus      tab x2  . Therapeutic abortion     Family History  Problem Relation Age of Onset  . Adopted: Yes   History  Substance Use Topics  . Smoking status: Current Some Day Smoker -- 1.00 packs/day    Types: Cigarettes  . Smokeless tobacco: Never Used  . Alcohol Use: Yes     Comment: socially   OB History   Grav Para Term Preterm Abortions TAB SAB Ect Mult Living   5 2 2  2 2    2      Review of Systems  All other systems reviewed and are negative.    Allergies  Review of patient's allergies indicates no known allergies.  Home Medications   Current Outpatient Rx  Name  Route  Sig  Dispense  Refill  . HYDROcodone-acetaminophen (NORCO) 5-325 MG per tablet   Oral   Take 2 tablets by mouth every 4 (four) hours as needed for pain.   30 tablet   1   . hydrocortisone (ANUSOL-HC) 2.5 % rectal cream   Rectal   Place rectally 2 (two) times daily. Apply around anus for irritated & painful hemorrhoids   15 g   2   . lidocaine (XYLOCAINE) 5 % ointment    Topical   Apply topically as needed.   35.44 g   0   . medroxyPROGESTERone (DEPO-PROVERA) 150 MG/ML injection   Intramuscular   Inject 150 mg into the muscle every 3 (three) months.         . polyethylene glycol powder (GLYCOLAX/MIRALAX) powder   Oral   Take 17 g by mouth daily.   255 g   0    BP 124/75  Pulse 72  Temp(Src) 98.5 F (36.9 C) (Oral)  Resp 18  SpO2 100% Physical Exam  Nursing note and vitals reviewed. Constitutional: She is oriented to person, place, and time. She appears well-developed and well-nourished.  HENT:  Head: Normocephalic and atraumatic.  Eyes: Conjunctivae and EOM are normal. Pupils are equal, round, and reactive to light.  Neck: Normal range of motion. Neck supple.  Cardiovascular: Normal rate and regular rhythm.  Exam reveals no gallop and no friction rub.   No murmur heard. Pulmonary/Chest: Effort normal and breath sounds normal. No respiratory distress. She has no wheezes. She has no rales. She exhibits no tenderness.  Abdominal: Soft. Bowel sounds are normal. She exhibits no distension and no mass. There is no tenderness. There is no rebound and no guarding.  Musculoskeletal: Normal range of motion. She  exhibits edema. She exhibits no tenderness.  Very mild edema of the bilateral hands and feet  Neurological: She is alert and oriented to person, place, and time.  Skin: Skin is warm and dry.  Psychiatric: She has a normal mood and affect. Her behavior is normal. Judgment and thought content normal.    ED Course   Procedures (including critical care time)  Labs Reviewed  URINALYSIS, ROUTINE W REFLEX MICROSCOPIC - Abnormal; Notable for the following:    Leukocytes, UA TRACE (*)    All other components within normal limits  URINE MICROSCOPIC-ADD ON - Abnormal; Notable for the following:    Squamous Epithelial / LPF FEW (*)    All other components within normal limits  BASIC METABOLIC PANEL  POCT PREGNANCY, URINE   No results  found. 1. Edema     MDM  Patient with mild edema bilateral hands and feet. Discussed patient with Dr. Patria Mane who instructs me to order UA and chem 8 to evaluate for renal impairment. This is negative, the patient will be discharged to home. Recommend outpatient followup with PCP/rheumatology. Patient understands and agrees to plan. She is stable and ready for discharge.  Roxy Horseman, PA-C 08/22/12 [phone]

## 2012-08-22 NOTE — ED Notes (Signed)
Pt states that she has had foot swelling x 3 months.  States that it happens mostly when she is sitting down, not doing anything.

## 2012-08-23 NOTE — ED Provider Notes (Signed)
Medical screening examination/treatment/procedure(s) were performed by non-physician practitioner and as supervising physician I was immediately available for consultation/collaboration.   Lyanne Co, MD 08/23/12 [phone]

## 2012-09-13 ENCOUNTER — Emergency Department (HOSPITAL_COMMUNITY)
Admission: EM | Admit: 2012-09-13 | Discharge: 2012-09-13 | Disposition: A | Discharge status: Home / Self Care | Payer: Medicaid Other | Source: Home / Self Care | Attending: Emergency Medicine | Admitting: Emergency Medicine

## 2012-09-13 ENCOUNTER — Encounter (HOSPITAL_COMMUNITY): Payer: Self-pay

## 2012-09-13 ENCOUNTER — Emergency Department (INDEPENDENT_AMBULATORY_CARE_PROVIDER_SITE_OTHER): Payer: Medicaid Other

## 2012-09-13 DIAGNOSIS — J329 Chronic sinusitis, unspecified: Secondary | ICD-10-CM

## 2012-09-13 MED ORDER — AMOXICILLIN 500 MG PO CAPS: 500.0000 mg | ORAL_CAPSULE | Freq: Three times a day (TID) | ORAL | Status: DC

## 2012-09-13 NOTE — ED Provider Notes (Signed)
Medical screening examination/treatment/procedure(s) were performed by resident physician or non-physician practitioner and as supervising physician I was immediately available for consultation/collaboration.   Monaca Wadas DOUGLAS MD.   Jabbar Palmero D Yomaris Palecek, MD 09/13/12 2025 

## 2012-09-13 NOTE — Discharge Instructions (Signed)

## 2012-09-13 NOTE — ED Provider Notes (Signed)
CSN: [id number]     Arrival date & time 09/13/12  1242 History   First MD Initiated Contact with Patient 09/13/12 1415     Chief Complaint  Patient presents with  . Nasal Congestion   (Consider location/radiation/quality/duration/timing/severity/associated sxs/prior Treatment) Patient is a 27 y.o. female presenting with cough. The history is provided by the patient. No language interpreter was used.  Cough Cough characteristics:  Non-productive Severity:  Moderate Onset quality:  Gradual Timing:  Constant Progression:  Worsening Chronicity:  New Smoker: no   Relieved by:  Nothing Worsened by:  Nothing tried Associated symptoms: chest pain and sinus congestion    Pt complains of sinus pressure, congestion, Past Medical History  Diagnosis Date  . Abnormal Pap smear   . Gonorrhea   . Chlamydia    Past Surgical History  Procedure Laterality Date  . Colposcopy    . Dilation and curettage of uterus      tab x2  . Therapeutic abortion     Family History  Problem Relation Age of Onset  . Adopted: Yes   History  Substance Use Topics  . Smoking status: Current Some Day Smoker -- 1.00 packs/day    Types: Cigarettes  . Smokeless tobacco: Never Used  . Alcohol Use: Yes     Comment: socially   OB History   Grav Para Term Preterm Abortions TAB SAB Ect Mult Living   5 2 2  2 2    2      Review of Systems  Respiratory: Positive for cough.   Cardiovascular: Positive for chest pain.  All other systems reviewed and are negative.    Allergies  Review of patient's allergies indicates no known allergies.  Home Medications   Current Outpatient Rx  Name  Route  Sig  Dispense  Refill  . medroxyPROGESTERone (DEPO-PROVERA) 150 MG/ML injection   Intramuscular   Inject 150 mg into the muscle every 3 (three) months.         Marland Kitchen HYDROcodone-acetaminophen (NORCO) 5-325 MG per tablet   Oral   Take 2 tablets by mouth every 4 (four) hours as needed for pain.   30 tablet   1   .  hydrocortisone (ANUSOL-HC) 2.5 % rectal cream   Rectal   Place rectally 2 (two) times daily. Apply around anus for irritated & painful hemorrhoids   15 g   2   . lidocaine (XYLOCAINE) 5 % ointment   Topical   Apply topically as needed.   35.44 g   0   . polyethylene glycol powder (GLYCOLAX/MIRALAX) powder   Oral   Take 17 g by mouth daily.   255 g   0    BP 100/65  Pulse 93  Temp(Src) 98 F (36.7 C) (Oral)  Resp 16  SpO2 99% Physical Exam  Nursing note and vitals reviewed. Constitutional: She appears well-developed and well-nourished.  HENT:  Head: Normocephalic.  Right Ear: External ear normal.  Left Ear: External ear normal.  Nose: Nose normal.  Mouth/Throat: Oropharynx is clear and moist.  Eyes: Pupils are equal, round, and reactive to light.  Neck: Normal range of motion.  Cardiovascular: Normal rate.   Pulmonary/Chest: Effort normal.  Abdominal: Soft.  Neurological: She is alert.  Skin: Skin is warm.  Psychiatric: She has a normal mood and affect.    ED Course  Procedures (including critical care time) Labs Review Labs Reviewed - No data to display Imaging Review Dg Chest 2 View  09/13/2012   *RADIOLOGY REPORT*  Clinical Data: Cough  CHEST - 2 VIEW  Comparison: None.  Findings: Lungs clear.  Heart size and pulmonary vascularity are normal.  No adenopathy.  No bone lesions.  IMPRESSION: No abnormality noted.   Original Report Authenticated By: Bretta Bang, M.D.    MDM   1. Sinusitis    amoxicillian    Elson Areas, PA-C 09/13/12 1512

## 2012-09-13 NOTE — ED Notes (Addendum)
C/o congestion, cough x past couple of days, non-productive cough, no nasal secretions although feels stuffy; ? Wheezing RUQ on ascultation

## 2012-11-21 ENCOUNTER — Encounter (HOSPITAL_COMMUNITY): Payer: Self-pay | Admitting: Emergency Medicine

## 2012-11-21 ENCOUNTER — Emergency Department (HOSPITAL_COMMUNITY)
Admission: EM | Admit: 2012-11-21 | Discharge: 2012-11-21 | Disposition: A | Discharge status: Home / Self Care | Payer: Medicaid Other | Attending: Emergency Medicine | Admitting: Emergency Medicine

## 2012-11-21 DIAGNOSIS — Z3202 Encounter for pregnancy test, result negative: Secondary | ICD-10-CM | POA: Insufficient documentation

## 2012-11-21 DIAGNOSIS — Z8619 Personal history of other infectious and parasitic diseases: Secondary | ICD-10-CM | POA: Insufficient documentation

## 2012-11-21 DIAGNOSIS — J111 Influenza due to unidentified influenza virus with other respiratory manifestations: Secondary | ICD-10-CM | POA: Insufficient documentation

## 2012-11-21 DIAGNOSIS — F17200 Nicotine dependence, unspecified, uncomplicated: Secondary | ICD-10-CM | POA: Insufficient documentation

## 2012-11-21 LAB — POCT I-STAT, CHEM 8
Calcium, Ion: 1.29 mmol/L — ABNORMAL HIGH (ref 1.12–1.23)
Creatinine, Ser: 1.1 mg/dL (ref 0.50–1.10)
Glucose, Bld: 104 mg/dL — ABNORMAL HIGH (ref 70–99)
Hemoglobin: 13.9 g/dL (ref 12.0–15.0)
Sodium: 138 mEq/L (ref 135–145)
TCO2: 23 mmol/L (ref 0–100)

## 2012-11-21 LAB — URINALYSIS, ROUTINE W REFLEX MICROSCOPIC
Hgb urine dipstick: NEGATIVE
Protein, ur: NEGATIVE mg/dL
Urobilinogen, UA: 0.2 mg/dL (ref 0.0–1.0)
pH: 6 (ref 5.0–8.0)

## 2012-11-21 LAB — URINE MICROSCOPIC-ADD ON

## 2012-11-21 LAB — CBC WITH DIFFERENTIAL/PLATELET
Basophils Absolute: 0 10*3/uL (ref 0.0–0.1)
Basophils Relative: 0 % (ref 0–1)
Eosinophils Relative: 0 % (ref 0–5)
Lymphocytes Relative: 14 % (ref 12–46)
MCHC: 35.7 g/dL (ref 30.0–36.0)
MCV: 85.7 fL (ref 78.0–100.0)
Neutro Abs: 11.3 10*3/uL — ABNORMAL HIGH (ref 1.7–7.7)
Platelets: 239 10*3/uL (ref 150–400)
RDW: 12.4 % (ref 11.5–15.5)
WBC: 14.9 10*3/uL — ABNORMAL HIGH (ref 4.0–10.5)

## 2012-11-21 MED ORDER — ONDANSETRON HCL 4 MG/2ML IJ SOLN
4.0000 mg | Freq: Once | INTRAMUSCULAR | Status: AC
  Administered 2012-11-21: 4 mg via INTRAVENOUS
  Filled 2012-11-21: qty 2

## 2012-11-21 MED ORDER — ACETAMINOPHEN 325 MG PO TABS
650.0000 mg | ORAL_TABLET | Freq: Once | ORAL | Status: AC
  Administered 2012-11-21: 650 mg via ORAL
  Filled 2012-11-21: qty 2

## 2012-11-21 MED ORDER — MORPHINE SULFATE 4 MG/ML IJ SOLN
4.0000 mg | Freq: Once | INTRAMUSCULAR | Status: AC
  Administered 2012-11-21: 4 mg via INTRAVENOUS
  Filled 2012-11-21: qty 1

## 2012-11-21 MED ORDER — TRAMADOL HCL 50 MG PO TABS: 50.0000 mg | ORAL_TABLET | Freq: Four times a day (QID) | ORAL | Status: DC | PRN

## 2012-11-21 MED ORDER — SODIUM CHLORIDE 0.9 % IV BOLUS (SEPSIS)
1000.0000 mL | Freq: Once | INTRAVENOUS | Status: AC
  Administered 2012-11-21: 1000 mL via INTRAVENOUS

## 2012-11-21 NOTE — ED Provider Notes (Addendum)
CSN: [id number]     Arrival date & time 11/21/12  1929 History   First MD Initiated Contact with Patient 11/21/12 2045     Chief Complaint  Patient presents with  . Generalized Body Aches   (Consider location/radiation/quality/duration/timing/severity/associated sxs/prior Treatment) Patient is a 27 y.o. female presenting with flu symptoms. The history is provided by the patient.  Influenza Presenting symptoms: fever, headache and myalgias   Presenting symptoms: no nausea and no vomiting   Severity:  Severe Onset quality:  Gradual Duration:  1 day Progression:  Worsening Chronicity:  New Relieved by:  Nothing Worsened by:  Nothing tried Ineffective treatments:  None tried Associated symptoms: chills, decreased appetite and decreased physical activity   Associated symptoms: no ear pain, no mental status change, no neck stiffness and no witnessed syncope   Risk factors: sick contacts   Risk factors: no immunocompromised state and not pregnant     Past Medical History  Diagnosis Date  . Abnormal Pap smear   . Gonorrhea   . Chlamydia    Past Surgical History  Procedure Laterality Date  . Colposcopy    . Dilation and curettage of uterus      tab x2  . Therapeutic abortion     Family History  Problem Relation Age of Onset  . Adopted: Yes   History  Substance Use Topics  . Smoking status: Current Some Day Smoker -- 1.00 packs/day    Types: Cigarettes  . Smokeless tobacco: Never Used  . Alcohol Use: Yes     Comment: socially   OB History   Grav Para Term Preterm Abortions TAB SAB Ect Mult Living   5 2 2  2 2    2      Review of Systems  Constitutional: Positive for fever, chills and decreased appetite.  HENT: Negative for ear pain.   Gastrointestinal: Negative for nausea and vomiting.  Musculoskeletal: Positive for myalgias. Negative for neck stiffness.  Neurological: Positive for headaches.  All other systems reviewed and are negative.    Allergies  Review  of patient's allergies indicates no known allergies.  Home Medications  No current outpatient prescriptions on file. BP 108/70  Pulse 73  Temp(Src) 99.4 F (37.4 C) (Oral)  Resp 20  Ht 4\' 11"  (1.499 m)  Wt 125 lb (56.7 kg)  BMI 25.23 kg/m2  SpO2 100%  LMP 11/07/2012 Physical Exam  Nursing note and vitals reviewed. Constitutional: She is oriented to person, place, and time. She appears well-developed and well-nourished. No distress.  HENT:  Head: Normocephalic and atraumatic.  Right Ear: Tympanic membrane normal.  Left Ear: Tympanic membrane normal.  Mouth/Throat: Oropharynx is clear and moist.  Eyes: Conjunctivae and EOM are normal. Pupils are equal, round, and reactive to light.  Neck: Normal range of motion. Neck supple.  Cardiovascular: Normal rate, regular rhythm and intact distal pulses.   No murmur heard. Pulmonary/Chest: Effort normal and breath sounds normal. No respiratory distress. She has no wheezes. She has no rales.  Abdominal: Soft. She exhibits no distension. There is no tenderness. There is no rebound and no guarding.  Musculoskeletal: Normal range of motion. She exhibits tenderness. She exhibits no edema.  Generalized tenderness over all extremities  Neurological: She is alert and oriented to person, place, and time.  Skin: Skin is warm and dry. No rash noted. No erythema.  Psychiatric: She has a normal mood and affect. Her behavior is normal.    ED Course  Procedures (including critical care time) Labs  Review Labs Reviewed  CBC WITH DIFFERENTIAL - Abnormal; Notable for the following:    WBC 14.9 (*)    HCT 35.9 (*)    Neutro Abs 11.3 (*)    Monocytes Absolute 1.5 (*)    All other components within normal limits  POCT I-STAT, CHEM 8 - Abnormal; Notable for the following:    Potassium 3.4 (*)    BUN <3 (*)    Glucose, Bld 104 (*)    Calcium, Ion 1.29 (*)    All other components within normal limits  URINALYSIS, ROUTINE W REFLEX MICROSCOPIC  POCT  PREGNANCY, URINE   Imaging Review No results found.  EKG Interpretation   None       MDM   1. Influenza     Pt with symptoms consistent with influenza.  Normal exam here but is febrile.  No signs of breathing difficulty  No signs of strep pharyngitis, otitis or abnormal abdominal findings.   Will give supportive care with fluids and pain meds.  No flu shot this year and pt works at [employer].  Will continue antipyretica and rest and fluids and return for any further problems.   11:03 PM Labs with leukocytosis but o/w wnl.  Pt improved after antipyretic.  Will d/c with supportive care.  Gwyneth Sprout, MD 11/21/12 9811  Gwyneth Sprout, MD 11/21/12 9147

## 2012-11-21 NOTE — Progress Notes (Signed)
Patient confirms her pcp is located at  [facility].  Patient does not see any particular physician there.

## 2012-11-21 NOTE — ED Notes (Signed)
Pt reports generalized body aches, 10/10, and weakness since this morning, reports she has not taken any medication for this, denies n/v/d, denies ShOB or cough, denies any other complaints related to this at this time.

## 2012-11-24 ENCOUNTER — Emergency Department (HOSPITAL_COMMUNITY)
Admission: EM | Admit: 2012-11-24 | Discharge: 2012-11-24 | Disposition: A | Discharge status: Home / Self Care | Payer: Medicaid Other | Attending: Emergency Medicine | Admitting: Emergency Medicine

## 2012-11-24 ENCOUNTER — Encounter (HOSPITAL_COMMUNITY): Payer: Self-pay | Admitting: Emergency Medicine

## 2012-11-24 DIAGNOSIS — R05 Cough: Secondary | ICD-10-CM | POA: Insufficient documentation

## 2012-11-24 DIAGNOSIS — R509 Fever, unspecified: Secondary | ICD-10-CM | POA: Insufficient documentation

## 2012-11-24 DIAGNOSIS — R059 Cough, unspecified: Secondary | ICD-10-CM | POA: Insufficient documentation

## 2012-11-24 DIAGNOSIS — R110 Nausea: Secondary | ICD-10-CM | POA: Insufficient documentation

## 2012-11-24 DIAGNOSIS — R5381 Other malaise: Secondary | ICD-10-CM | POA: Insufficient documentation

## 2012-11-24 DIAGNOSIS — Z8619 Personal history of other infectious and parasitic diseases: Secondary | ICD-10-CM | POA: Insufficient documentation

## 2012-11-24 DIAGNOSIS — H53149 Visual discomfort, unspecified: Secondary | ICD-10-CM | POA: Insufficient documentation

## 2012-11-24 DIAGNOSIS — R52 Pain, unspecified: Secondary | ICD-10-CM | POA: Insufficient documentation

## 2012-11-24 DIAGNOSIS — J3489 Other specified disorders of nose and nasal sinuses: Secondary | ICD-10-CM | POA: Insufficient documentation

## 2012-11-24 DIAGNOSIS — H6691 Otitis media, unspecified, right ear: Secondary | ICD-10-CM

## 2012-11-24 DIAGNOSIS — E876 Hypokalemia: Secondary | ICD-10-CM | POA: Insufficient documentation

## 2012-11-24 DIAGNOSIS — R51 Headache: Secondary | ICD-10-CM | POA: Insufficient documentation

## 2012-11-24 DIAGNOSIS — J111 Influenza due to unidentified influenza virus with other respiratory manifestations: Secondary | ICD-10-CM | POA: Insufficient documentation

## 2012-11-24 DIAGNOSIS — Z7982 Long term (current) use of aspirin: Secondary | ICD-10-CM | POA: Insufficient documentation

## 2012-11-24 DIAGNOSIS — H6690 Otitis media, unspecified, unspecified ear: Secondary | ICD-10-CM | POA: Insufficient documentation

## 2012-11-24 DIAGNOSIS — F17200 Nicotine dependence, unspecified, uncomplicated: Secondary | ICD-10-CM | POA: Insufficient documentation

## 2012-11-24 DIAGNOSIS — E049 Nontoxic goiter, unspecified: Secondary | ICD-10-CM | POA: Insufficient documentation

## 2012-11-24 LAB — POCT I-STAT, CHEM 8
BUN: <3 mg/dL — ABNORMAL LOW (ref 6–23)
Creatinine, Ser: 0.8 mg/dL (ref 0.50–1.10)
Glucose, Bld: 95 mg/dL (ref 70–99)
Hemoglobin: 11.9 g/dL — ABNORMAL LOW (ref 12.0–15.0)
TCO2: 25 mmol/L (ref 0–100)

## 2012-11-24 LAB — CBC WITH DIFFERENTIAL/PLATELET
Basophils Absolute: 0 10*3/uL (ref 0.0–0.1)
Eosinophils Absolute: 0.1 10*3/uL (ref 0.0–0.7)
Eosinophils Relative: 1 % (ref 0–5)
HCT: 32.4 % — ABNORMAL LOW (ref 36.0–46.0)
Hemoglobin: 11.6 g/dL — ABNORMAL LOW (ref 12.0–15.0)
Lymphs Abs: 1.6 10*3/uL (ref 0.7–4.0)
MCH: 30.8 pg (ref 26.0–34.0)
MCV: 85.9 fL (ref 78.0–100.0)
Monocytes Absolute: 0.8 10*3/uL (ref 0.1–1.0)
Monocytes Relative: 10 % (ref 3–12)
Platelets: 270 10*3/uL (ref 150–400)
RBC: 3.77 MIL/uL — ABNORMAL LOW (ref 3.87–5.11)

## 2012-11-24 MED ORDER — AMOXICILLIN 500 MG PO CAPS: 1000.0000 mg | ORAL_CAPSULE | Freq: Three times a day (TID) | ORAL | Status: DC

## 2012-11-24 MED ORDER — HYDROCODONE-ACETAMINOPHEN 5-325 MG PO TABS: ORAL_TABLET | ORAL | Status: DC

## 2012-11-24 MED ORDER — NAPROXEN 500 MG PO TABS: 500.0000 mg | ORAL_TABLET | Freq: Two times a day (BID) | ORAL | Status: DC

## 2012-11-24 MED ORDER — HYDROCODONE-ACETAMINOPHEN 5-325 MG PO TABS
1.0000 | ORAL_TABLET | Freq: Once | ORAL | Status: AC
  Administered 2012-11-24: 1 via ORAL
  Filled 2012-11-24: qty 1

## 2012-11-24 MED ORDER — SODIUM CHLORIDE 0.9 % IV BOLUS (SEPSIS)
1000.0000 mL | Freq: Once | INTRAVENOUS | Status: AC
  Administered 2012-11-24: 1000 mL via INTRAVENOUS

## 2012-11-24 MED ORDER — KETOROLAC TROMETHAMINE 30 MG/ML IJ SOLN
30.0000 mg | Freq: Once | INTRAMUSCULAR | Status: AC
  Administered 2012-11-24: 30 mg via INTRAVENOUS
  Filled 2012-11-24: qty 1

## 2012-11-24 MED ORDER — POTASSIUM CHLORIDE CRYS ER 20 MEQ PO TBCR
40.0000 meq | EXTENDED_RELEASE_TABLET | Freq: Once | ORAL | Status: AC
  Administered 2012-11-24: 40 meq via ORAL
  Filled 2012-11-24: qty 2

## 2012-11-24 MED ORDER — METOCLOPRAMIDE HCL 5 MG/ML IJ SOLN
10.0000 mg | Freq: Once | INTRAMUSCULAR | Status: AC
  Administered 2012-11-24: 10 mg via INTRAVENOUS
  Filled 2012-11-24: qty 2

## 2012-11-24 NOTE — ED Provider Notes (Signed)
  Medical screening examination/treatment/procedure(s) were performed by non-physician practitioner and as supervising physician I was immediately available for consultation/collaboration.  EKG Interpretation   None          Gerhard Munch, MD 11/24/12 1552

## 2012-11-24 NOTE — ED Notes (Signed)
PA at bedside.

## 2012-11-24 NOTE — ED Provider Notes (Signed)
CSN: [id number]     Arrival date & time 11/24/12  1057 History   First MD Initiated Contact with Patient 11/24/12 1110     Chief Complaint  Patient presents with  . Migraine  . Otalgia   (Consider location/radiation/quality/duration/timing/severity/associated sxs/prior Treatment) HPI Comments: Patient with recent emergency department visit 3 days ago for similar symptoms presents with continued subjective fever, body aches, fatigue, malaise. Patient was diagnosed with influenza-like illness in emergency department and had blood work, UA, negative UPT. She was discharged home with Ultram. Patient notes right ear pain it is becoming worse since that time. She has taken all of her Ultram. She has also been using over-the-counter cold medicine without relief. She continues to complain of throbbing occipital headache. No neck pain or stiffness. No vision change but she does have photophobia with her headache. She has some nausea but no vomiting. Mild intermittent cough. No abdominal pain or diarrhea. No urinary symptoms. The onset of this condition was acute. The course is constant. Aggravating factors: none. Alleviating factors: none.    The history is provided by the patient and medical records.    Past Medical History  Diagnosis Date  . Abnormal Pap smear   . Gonorrhea   . Chlamydia    Past Surgical History  Procedure Laterality Date  . Colposcopy    . Dilation and curettage of uterus      tab x2  . Therapeutic abortion     Family History  Problem Relation Age of Onset  . Adopted: Yes   History  Substance Use Topics  . Smoking status: Current Some Day Smoker -- 1.00 packs/day    Types: Cigarettes  . Smokeless tobacco: Never Used  . Alcohol Use: Yes     Comment: socially   OB History   Grav Para Term Preterm Abortions TAB SAB Ect Mult Living   5 2 2  2 2    2      Review of Systems  Constitutional: Positive for fever and fatigue. Negative for chills.  HENT: Positive for  congestion, ear pain and rhinorrhea. Negative for ear discharge, sinus pressure and sore throat.   Eyes: Negative for redness.  Respiratory: Positive for cough. Negative for wheezing.   Cardiovascular: Negative for chest pain, palpitations and leg swelling.  Gastrointestinal: Positive for nausea. Negative for vomiting, abdominal pain and diarrhea.  Genitourinary: Negative for dysuria.  Musculoskeletal: Negative for myalgias and neck stiffness.  Skin: Negative for rash.  Neurological: Positive for headaches.  Hematological: Negative for adenopathy.    Allergies  Review of patient's allergies indicates no known allergies.  Home Medications   Current Outpatient Rx  Name  Route  Sig  Dispense  Refill  . aspirin 500 MG tablet   Oral   Take 500 mg by mouth every 6 (six) hours as needed for pain.         Marland Kitchen Phenyleph-CPM-DM-APAP (TYLENOL COLD HEAD CONGESTION PO)   Oral   Take 2 tablets by mouth every 4 (four) hours.         . traMADol (ULTRAM) 50 MG tablet   Oral   Take 1 tablet (50 mg total) by mouth every 6 (six) hours as needed.   15 tablet   0    BP 107/69  Pulse 72  Temp(Src) 98.1 F (36.7 C) (Oral)  Resp 18  SpO2 99%  LMP 11/07/2012 Physical Exam  Nursing note and vitals reviewed. Constitutional: She appears well-developed and well-nourished.  HENT:  Head: Normocephalic and  atraumatic.  Right Ear: External ear and ear canal normal. No tenderness. Tympanic membrane is injected and erythematous.  Left Ear: Tympanic membrane, external ear and ear canal normal. No tenderness. Tympanic membrane is not injected and not erythematous.  Nose: Nose normal. No mucosal edema or rhinorrhea.  Mouth/Throat: Uvula is midline, oropharynx is clear and moist and mucous membranes are normal. Mucous membranes are not dry. No oral lesions. No trismus in the jaw. No uvula swelling. No oropharyngeal exudate, posterior oropharyngeal edema, posterior oropharyngeal erythema or tonsillar  abscesses.  Eyes: Conjunctivae are normal. Right eye exhibits no discharge. Left eye exhibits no discharge.  Neck: Normal range of motion. Neck supple. Thyromegaly present.  Mild, diffuse thyromegaly.   Cardiovascular: Normal rate, regular rhythm and normal heart sounds.   Pulmonary/Chest: Effort normal and breath sounds normal. No respiratory distress. She has no wheezes. She has no rales.  Abdominal: Soft. There is no tenderness.  Lymphadenopathy:    She has no cervical adenopathy.  Neurological: She is alert.  Skin: Skin is warm and dry.  Psychiatric: She has a normal mood and affect.    ED Course  Procedures (including critical care time) Labs Review Labs Reviewed  CBC WITH DIFFERENTIAL - Abnormal; Notable for the following:    RBC 3.77 (*)    Hemoglobin 11.6 (*)    HCT 32.4 (*)    All other components within normal limits  POCT I-STAT, CHEM 8 - Abnormal; Notable for the following:    Potassium 3.2 (*)    BUN <3 (*)    Hemoglobin 11.9 (*)    HCT 35.0 (*)    All other components within normal limits   Imaging Review No results found.  EKG Interpretation   None      11:29 AM Patient seen and examined. Work-up initiated. Medications ordered.   Vital signs reviewed and are as follows: Filed Vitals:   11/24/12 1102  BP: 107/69  Pulse: 72  Temp: 98.1 F (36.7 C)  Resp: 18   Patient was still having HA so reglan and PO vicodin ordered. Potassium for low potassium.   Pt feels improved. Informed of labs. Will d/c to home with amox and vicodin for pain.   Patient counseled on supportive care for infection and s/s to return including worsening symptoms, persistent fever, persistent vomiting, or if they have any other concerns.  Urged to see PCP if symptoms persist for more than 3 days. Patient verbalizes understanding and agrees with plan.       MDM   1. Otitis media, right   2. Influenza-like illness   3. Hypokalemia    Patient with continued viral type  symptoms however appears to have new otitis media on exam. She was given amoxicillin for this. She was treated for symptoms including HA. She has no meningeal signs on exam and do not suspect this. She appears well, non-toxic. Fluids given. VSS, afebrile in ED. Will continue supportive therapy.     Renne Crigler, PA-C 11/24/12 1506

## 2012-11-24 NOTE — ED Notes (Signed)
Pt sts pain is "a little better, but I still have a headache and my stomach is still upset."

## 2012-11-24 NOTE — ED Notes (Signed)
Pt escorted to discharge window. Verbalized understanding discharge instructions. In no acute distress. Vitals reviewed and WDL.  

## 2012-11-24 NOTE — ED Notes (Signed)
Pt c/o migraine and right ear ache since she left here on 11/18. Pt still c/o body aches and not feeling good.

## 2013-06-18 ENCOUNTER — Encounter (HOSPITAL_COMMUNITY): Payer: Self-pay | Admitting: Emergency Medicine

## 2013-06-18 DIAGNOSIS — Z7982 Long term (current) use of aspirin: Secondary | ICD-10-CM | POA: Insufficient documentation

## 2013-06-18 DIAGNOSIS — Z3202 Encounter for pregnancy test, result negative: Secondary | ICD-10-CM | POA: Insufficient documentation

## 2013-06-18 DIAGNOSIS — Z8619 Personal history of other infectious and parasitic diseases: Secondary | ICD-10-CM | POA: Insufficient documentation

## 2013-06-18 DIAGNOSIS — Z9889 Other specified postprocedural states: Secondary | ICD-10-CM | POA: Insufficient documentation

## 2013-06-18 DIAGNOSIS — R928 Other abnormal and inconclusive findings on diagnostic imaging of breast: Secondary | ICD-10-CM | POA: Insufficient documentation

## 2013-06-18 DIAGNOSIS — N644 Mastodynia: Secondary | ICD-10-CM | POA: Insufficient documentation

## 2013-06-18 DIAGNOSIS — F17200 Nicotine dependence, unspecified, uncomplicated: Secondary | ICD-10-CM | POA: Insufficient documentation

## 2013-06-18 NOTE — ED Notes (Signed)
Pt. reports right breast lump , bilateral breast pain with " milky" discharge onset last week , denies fever , no injury.

## 2013-06-19 ENCOUNTER — Emergency Department (HOSPITAL_COMMUNITY)
Admission: EM | Admit: 2013-06-19 | Discharge: 2013-06-19 | Disposition: A | Discharge status: Home / Self Care | Payer: Medicaid Other | Attending: Emergency Medicine | Admitting: Emergency Medicine

## 2013-06-19 DIAGNOSIS — N63 Unspecified lump in unspecified breast: Secondary | ICD-10-CM

## 2013-06-19 DIAGNOSIS — N644 Mastodynia: Secondary | ICD-10-CM

## 2013-06-19 LAB — PREGNANCY, URINE: Preg Test, Ur: NEGATIVE

## 2013-06-19 MED ORDER — IBUPROFEN 600 MG PO TABS: 600.0000 mg | ORAL_TABLET | Freq: Four times a day (QID) | ORAL | Status: DC | PRN

## 2013-06-19 MED ORDER — TRAMADOL HCL 50 MG PO TABS
50.0000 mg | ORAL_TABLET | Freq: Once | ORAL | Status: AC
  Administered 2013-06-19: 50 mg via ORAL
  Filled 2013-06-19: qty 1

## 2013-06-19 MED ORDER — TRAMADOL HCL 50 MG PO TABS: 50.0000 mg | ORAL_TABLET | Freq: Four times a day (QID) | ORAL | Status: DC | PRN

## 2013-06-19 NOTE — Discharge Instructions (Signed)
:   Make an appointment to followup with your gynecologist. Return immediately for worsening pain, fever or for any concerns.  Breast Tenderness Breast tenderness is a common problem for women of all ages. Breast tenderness may cause mild discomfort to severe pain. It has a variety of causes. Your health care provider will find out the likely cause of your breast tenderness by examining your breasts, asking you about symptoms, and ordering some tests. Breast tenderness usually does not mean you have breast cancer. HOME CARE INSTRUCTIONS  Breast tenderness often can be handled at home. You can try:  Getting fitted for a new bra that provides more support, especially during exercise.  Wearing a more supportive bra or sports bra while sleeping when your breasts are very tender.  If you have a breast injury, apply ice to the area:  Put ice in a plastic bag.  Place a towel between your skin and the bag.  Leave the ice on for 20 minutes, 2 3 times a day.  If your breasts are too full of milk as a result of breastfeeding, try:  Expressing milk either by hand or with a breast pump.  Applying a warm compress to the breasts for relief.  Taking over-the-counter pain relievers, if approved by your health care provider.  Taking other medicines that your health care provider prescribes. These may include antibiotic medicines or birth control pills. Over the long term, your breast tenderness might be eased if you:  Cut down on caffeine.  Reduce the amount of fat in your diet. Keep a log of the days and times when your breasts are most tender. This will help you and your health care provider find the cause of the tenderness and how to relieve it. Also, learn how to do breast exams at home. This will help you notice if you have an unusual growth or lump that could cause tenderness. SEEK MEDICAL CARE IF:   Any part of your breast is hard, red, and hot to the touch. This could be a sign of  infection.  Fluid is coming out of your nipples (and you are not breastfeeding). Especially watch for blood or pus.  You have a fever as well as breast tenderness.  You have a new or painful lump in your breast that remains after your menstrual period ends.  You have tried to take care of the pain at home, but it has not gone away.  Your breast pain is getting worse, or the pain is making it hard to do the things you usually do during your day. Document Released: 12/04/2007 Document Revised: 08/23/2012 Document Reviewed: 07/20/2012 [facility] Patient Information 2014 [place], [place].

## 2013-06-19 NOTE — ED Notes (Signed)
Patient declined wheel chair, RN escorted to exit

## 2013-06-23 NOTE — ED Provider Notes (Signed)
CSN: [id number]     Arrival date & time 06/18/13  2247 History   First MD Initiated Contact with Patient 06/19/13 0204     Chief Complaint  Patient presents with  . Breast Mass     (Consider location/radiation/quality/duration/timing/severity/associated sxs/prior Treatment) HPI Patient presents with one week of bilateral breast pain. She states the right breast is more tender than the left breast. She's also has noticed small nodules near the nipple. These are tender to palpation. She's had no fevers or chills. Denies any color change, warmth or swelling. She states she is currently on her menstrual period. No history of breast cancer in her family. Past Medical History  Diagnosis Date  . Abnormal Pap smear   . Gonorrhea   . Chlamydia    Past Surgical History  Procedure Laterality Date  . Colposcopy    . Dilation and curettage of uterus      tab x2  . Therapeutic abortion     Family History  Problem Relation Age of Onset  . Adopted: Yes   History  Substance Use Topics  . Smoking status: Current Some Day Smoker -- 1.00 packs/day    Types: Cigarettes  . Smokeless tobacco: Never Used  . Alcohol Use: Yes     Comment: socially   OB History   Grav Para Term Preterm Abortions TAB SAB Ect Mult Living   5 2 2  2 2    2      Review of Systems  Constitutional: Negative for fever and chills.  Gastrointestinal: Negative for nausea, vomiting and abdominal pain.  Musculoskeletal: Negative for back pain, myalgias, neck pain and neck stiffness.  Skin: Negative for rash and wound.  Neurological: Negative for dizziness, weakness, light-headedness and numbness.  All other systems reviewed and are negative.     Allergies  Review of patient's allergies indicates no known allergies.  Home Medications   Prior to Admission medications   Medication Sig Start Date End Date Taking? Authorizing Jamon [place]  aspirin 500 MG tablet Take 500 mg by mouth every 6 (six) hours as needed for pain.    Yes Historical Naureen Benton, MD  levothyroxine (SYNTHROID, LEVOTHROID) 75 MCG tablet Take 75 mcg by mouth daily before breakfast.   Yes Historical Ronda Rajkumar, MD  ibuprofen (ADVIL,MOTRIN) 600 MG tablet Take 1 tablet (600 mg total) by mouth every 6 (six) hours as needed. 06/19/13   Loren Raceravid Yelverton, MD  traMADol (ULTRAM) 50 MG tablet Take 1 tablet (50 mg total) by mouth every 6 (six) hours as needed. 06/19/13   Loren Raceravid Yelverton, MD   BP 100/58  Pulse 58  Temp(Src) 98.1 F (36.7 C) (Oral)  Resp 22  Ht 4\' 11"  (1.499 m)  Wt 122 lb 4 oz (55.452 kg)  BMI 24.68 kg/m2  SpO2 99%  LMP 06/16/2013 Physical Exam  Nursing note and vitals reviewed. Constitutional: She is oriented to person, place, and time. She appears well-developed and well-nourished. No distress.  HENT:  Head: Normocephalic and atraumatic.  Mouth/Throat: Oropharynx is clear and moist.  Eyes: EOM are normal. Pupils are equal, round, and reactive to light.  Neck: Normal range of motion. Neck supple.  Cardiovascular: Normal rate and regular rhythm.   Pulmonary/Chest: Effort normal and breath sounds normal. No respiratory distress. She has no wheezes. She has no rales. She exhibits tenderness.  Small nodules noted in the region of the area will on bilateral breasts. Mild tenderness. Freely mobile. No nipple discharge. Generalized breast tenderness bilaterally. No evidence of mastitis or cellulitis.  Abdominal: Soft. Bowel sounds are normal. She exhibits no distension and no mass. There is no tenderness. There is no rebound and no guarding.  Musculoskeletal: Normal range of motion. She exhibits no edema and no tenderness.  Neurological: She is alert and oriented to person, place, and time.  Skin: Skin is warm and dry. No rash noted. No erythema.  Psychiatric: She has a normal mood and affect. Her behavior is normal.    ED Course  Procedures (including critical care time) Labs Review Labs Reviewed  PREGNANCY, URINE    Imaging Review No  results found.   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 She's a device to follow with her primary Dr. return precautions have been given.      Loren Raceravid Yelverton, MD 06/23/13 [phone]

## 2013-07-20 ENCOUNTER — Emergency Department (HOSPITAL_COMMUNITY)
Admission: EM | Admit: 2013-07-20 | Discharge: 2013-07-20 | Disposition: A | Discharge status: Home / Self Care | Payer: Medicaid Other | Attending: Emergency Medicine | Admitting: Emergency Medicine

## 2013-07-20 ENCOUNTER — Encounter (HOSPITAL_COMMUNITY): Payer: Self-pay | Admitting: Emergency Medicine

## 2013-07-20 ENCOUNTER — Emergency Department (HOSPITAL_COMMUNITY): Payer: Medicaid Other

## 2013-07-20 DIAGNOSIS — S139XXA Sprain of joints and ligaments of unspecified parts of neck, initial encounter: Secondary | ICD-10-CM | POA: Diagnosis not present

## 2013-07-20 DIAGNOSIS — S3981XA Other specified injuries of abdomen, initial encounter: Secondary | ICD-10-CM | POA: Insufficient documentation

## 2013-07-20 DIAGNOSIS — S0003XA Contusion of scalp, initial encounter: Secondary | ICD-10-CM | POA: Insufficient documentation

## 2013-07-20 DIAGNOSIS — Z8619 Personal history of other infectious and parasitic diseases: Secondary | ICD-10-CM | POA: Insufficient documentation

## 2013-07-20 DIAGNOSIS — S59919A Unspecified injury of unspecified forearm, initial encounter: Secondary | ICD-10-CM

## 2013-07-20 DIAGNOSIS — S59909A Unspecified injury of unspecified elbow, initial encounter: Secondary | ICD-10-CM | POA: Diagnosis not present

## 2013-07-20 DIAGNOSIS — IMO0002 Reserved for concepts with insufficient information to code with codable children: Secondary | ICD-10-CM | POA: Diagnosis not present

## 2013-07-20 DIAGNOSIS — F17200 Nicotine dependence, unspecified, uncomplicated: Secondary | ICD-10-CM | POA: Diagnosis not present

## 2013-07-20 DIAGNOSIS — Z79899 Other long term (current) drug therapy: Secondary | ICD-10-CM | POA: Insufficient documentation

## 2013-07-20 DIAGNOSIS — S161XXA Strain of muscle, fascia and tendon at neck level, initial encounter: Secondary | ICD-10-CM

## 2013-07-20 DIAGNOSIS — S46909A Unspecified injury of unspecified muscle, fascia and tendon at shoulder and upper arm level, unspecified arm, initial encounter: Secondary | ICD-10-CM | POA: Diagnosis present

## 2013-07-20 DIAGNOSIS — S1093XA Contusion of unspecified part of neck, initial encounter: Secondary | ICD-10-CM

## 2013-07-20 DIAGNOSIS — S40019A Contusion of unspecified shoulder, initial encounter: Secondary | ICD-10-CM | POA: Diagnosis not present

## 2013-07-20 DIAGNOSIS — S6990XA Unspecified injury of unspecified wrist, hand and finger(s), initial encounter: Secondary | ICD-10-CM

## 2013-07-20 DIAGNOSIS — S0083XA Contusion of other part of head, initial encounter: Secondary | ICD-10-CM | POA: Insufficient documentation

## 2013-07-20 DIAGNOSIS — S4980XA Other specified injuries of shoulder and upper arm, unspecified arm, initial encounter: Secondary | ICD-10-CM | POA: Diagnosis present

## 2013-07-20 LAB — COMPREHENSIVE METABOLIC PANEL
ALBUMIN: 4.3 g/dL (ref 3.5–5.2)
ALK PHOS: 70 U/L (ref 39–117)
ALT: 7 U/L (ref 0–35)
AST: 15 U/L (ref 0–37)
Anion gap: 15 (ref 5–15)
BILIRUBIN TOTAL: 0.4 mg/dL (ref 0.3–1.2)
BUN: 10 mg/dL (ref 6–23)
CO2: 23 meq/L (ref 19–32)
Calcium: 9.3 mg/dL (ref 8.4–10.5)
Chloride: 104 mEq/L (ref 96–112)
Creatinine, Ser: 0.75 mg/dL (ref 0.50–1.10)
GFR calc Af Amer: >90 mL/min (ref >90)
GFR calc non Af Amer: >90 mL/min (ref >90)
Glucose, Bld: 99 mg/dL (ref 70–99)
POTASSIUM: 4 meq/L (ref 3.7–5.3)
Sodium: 142 mEq/L (ref 137–147)
Total Protein: 7.4 g/dL (ref 6.0–8.3)

## 2013-07-20 LAB — URINALYSIS, ROUTINE W REFLEX MICROSCOPIC
BILIRUBIN URINE: NEGATIVE
Glucose, UA: NEGATIVE mg/dL
HGB URINE DIPSTICK: NEGATIVE
Ketones, ur: NEGATIVE mg/dL
Leukocytes, UA: NEGATIVE
Nitrite: NEGATIVE
PH: 6 (ref 5.0–8.0)
Protein, ur: NEGATIVE mg/dL
Specific Gravity, Urine: 1.017 (ref 1.005–1.030)
Urobilinogen, UA: 1 mg/dL (ref 0.0–1.0)

## 2013-07-20 LAB — CBC WITH DIFFERENTIAL/PLATELET
BASOS PCT: 0 % (ref 0–1)
Basophils Absolute: 0 10*3/uL (ref 0.0–0.1)
Eosinophils Absolute: 0 10*3/uL (ref 0.0–0.7)
Eosinophils Relative: 0 % (ref 0–5)
HCT: 33.3 % — ABNORMAL LOW (ref 36.0–46.0)
Hemoglobin: 11.5 g/dL — ABNORMAL LOW (ref 12.0–15.0)
Lymphocytes Relative: 21 % (ref 12–46)
Lymphs Abs: 2.6 10*3/uL (ref 0.7–4.0)
MCH: 29.9 pg (ref 26.0–34.0)
MCHC: 34.5 g/dL (ref 30.0–36.0)
MCV: 86.7 fL (ref 78.0–100.0)
Monocytes Absolute: 0.9 10*3/uL (ref 0.1–1.0)
Monocytes Relative: 8 % (ref 3–12)
NEUTROS ABS: 8.8 10*3/uL — AB (ref 1.7–7.7)
NEUTROS PCT: 71 % (ref 43–77)
Platelets: 262 10*3/uL (ref 150–400)
RBC: 3.84 MIL/uL — AB (ref 3.87–5.11)
RDW: 12.7 % (ref 11.5–15.5)
WBC: 12.4 10*3/uL — ABNORMAL HIGH (ref 4.0–10.5)

## 2013-07-20 LAB — HCG, SERUM, QUALITATIVE: Preg, Serum: NEGATIVE

## 2013-07-20 LAB — PREGNANCY, URINE: Preg Test, Ur: NEGATIVE

## 2013-07-20 MED ORDER — SODIUM CHLORIDE 0.9 % IV BOLUS (SEPSIS)
1000.0000 mL | Freq: Once | INTRAVENOUS | Status: AC
  Administered 2013-07-20: 1000 mL via INTRAVENOUS

## 2013-07-20 MED ORDER — HYDROCODONE-ACETAMINOPHEN 5-325 MG PO TABS: 1.0000 | ORAL_TABLET | Freq: Four times a day (QID) | ORAL | Status: DC | PRN

## 2013-07-20 MED ORDER — MORPHINE SULFATE 4 MG/ML IJ SOLN
4.0000 mg | Freq: Once | INTRAMUSCULAR | Status: AC
  Administered 2013-07-20: 4 mg via INTRAVENOUS
  Filled 2013-07-20: qty 1

## 2013-07-20 MED ORDER — IOHEXOL 300 MG/ML  SOLN
100.0000 mL | Freq: Once | INTRAMUSCULAR | Status: AC | PRN
  Administered 2013-07-20: 100 mL via INTRAVENOUS

## 2013-07-20 MED ORDER — DIAZEPAM 5 MG/ML IJ SOLN
5.0000 mg | Freq: Once | INTRAMUSCULAR | Status: DC
  Filled 2013-07-20: qty 2

## 2013-07-20 NOTE — Discharge Instructions (Signed)
Take motrin 600 mg every 6 hrs for pain.   Take vicodin for severe pain. Do NOT drive with it.   You are likely to have pain for several days to a week.   Follow up with your doctor.   Stay safe.   Return to ER if you have severe pain, vomiting, headache.
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
## 2013-07-20 NOTE — ED Notes (Signed)
MD at bedside. 
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
## 2013-09-23 ENCOUNTER — Encounter (HOSPITAL_COMMUNITY): Payer: Self-pay | Admitting: Emergency Medicine

## 2013-09-23 ENCOUNTER — Emergency Department (HOSPITAL_COMMUNITY)
Admission: EM | Admit: 2013-09-23 | Discharge: 2013-09-23 | Disposition: A | Discharge status: Home / Self Care | Payer: Medicaid Other | Attending: Emergency Medicine | Admitting: Emergency Medicine

## 2013-09-23 ENCOUNTER — Emergency Department (HOSPITAL_COMMUNITY): Payer: Medicaid Other

## 2013-09-23 DIAGNOSIS — S59909A Unspecified injury of unspecified elbow, initial encounter: Secondary | ICD-10-CM | POA: Insufficient documentation

## 2013-09-23 DIAGNOSIS — F17200 Nicotine dependence, unspecified, uncomplicated: Secondary | ICD-10-CM | POA: Insufficient documentation

## 2013-09-23 DIAGNOSIS — Z79899 Other long term (current) drug therapy: Secondary | ICD-10-CM | POA: Diagnosis not present

## 2013-09-23 DIAGNOSIS — S6991XA Unspecified injury of right wrist, hand and finger(s), initial encounter: Secondary | ICD-10-CM

## 2013-09-23 DIAGNOSIS — S0990XA Unspecified injury of head, initial encounter: Secondary | ICD-10-CM | POA: Diagnosis not present

## 2013-09-23 DIAGNOSIS — S6990XA Unspecified injury of unspecified wrist, hand and finger(s), initial encounter: Principal | ICD-10-CM

## 2013-09-23 DIAGNOSIS — Z8619 Personal history of other infectious and parasitic diseases: Secondary | ICD-10-CM | POA: Diagnosis not present

## 2013-09-23 DIAGNOSIS — T07XXXA Unspecified multiple injuries, initial encounter: Secondary | ICD-10-CM | POA: Diagnosis not present

## 2013-09-23 DIAGNOSIS — S59919A Unspecified injury of unspecified forearm, initial encounter: Principal | ICD-10-CM

## 2013-09-23 MED ORDER — OXYCODONE-ACETAMINOPHEN 5-325 MG PO TABS
1.0000 | ORAL_TABLET | Freq: Once | ORAL | Status: AC
  Administered 2013-09-23: 1 via ORAL
  Filled 2013-09-23: qty 1

## 2013-09-23 MED ORDER — OXYCODONE-ACETAMINOPHEN 5-325 MG PO TABS: 1.0000 | ORAL_TABLET | Freq: Four times a day (QID) | ORAL | Status: DC | PRN

## 2013-09-23 NOTE — Discharge Instructions (Signed)
Assault, General °Assault includes any behavior, whether intentional or reckless, which results in bodily injury to another person and/or damage to property. Included in this would be any behavior, intentional or reckless, that by its nature would be understood (interpreted) by a reasonable person as intent to harm another person or to damage his/her property. Threats may be oral or written. They may be communicated through regular mail, computer, fax, or phone. These threats may be direct or implied. °FORMS OF ASSAULT INCLUDE: °· Physically assaulting a person. This includes physical threats to inflict physical harm as well as: °¨ Slapping. °¨ Hitting. °¨ Poking. °¨ Kicking. °¨ Punching. °¨ Pushing. °· Arson. °· Sabotage. °· Equipment vandalism. °· Damaging or destroying property. °· Throwing or hitting objects. °· Displaying a weapon or an object that appears to be a weapon in a threatening manner. °¨ Carrying a firearm of any kind. °¨ Using a weapon to harm someone. °· Using greater physical size/strength to intimidate another. °¨ Making intimidating or threatening gestures. °¨ Bullying. °¨ Hazing. °· Intimidating, threatening, hostile, or abusive language directed toward another person. °¨ It communicates the intention to engage in violence against that person. And it leads a reasonable person to expect that violent behavior may occur. °· Stalking another person. °IF IT HAPPENS AGAIN: °· Immediately call for emergency help (911 in [place] °· If someone poses clear and immediate danger to you, seek legal authorities to have a protective or restraining order put in place. °· Less threatening assaults can at least be reported to authorities. °STEPS TO TAKE IF A SEXUAL ASSAULT HAS HAPPENED °· Go to an area of safety. This may include a shelter or staying with a friend. Stay away from the area where you have been attacked. A large percentage of sexual assaults are caused by a friend, relative or associate. °· If  medications were given by your caregiver, take them as directed for the full length of time prescribed. °· Only take over-the-counter or prescription medicines for pain, discomfort, or fever as directed by your caregiver. °· If you have come in contact with a sexual disease, find out if you are to be tested again. If your caregiver is concerned about the HIV/AIDS virus, he/she may require you to have continued testing for several months. °· For the protection of your privacy, test results can not be given over the phone. Make sure you receive the results of your test. If your test results are not back during your visit, make an appointment with your caregiver to find out the results. Do not assume everything is normal if you have not heard from your caregiver or the medical facility. It is important for you to follow up on all of your test results. °· File appropriate papers with authorities. This is important in all assaults, even if it has occurred in a family or by a friend. °SEEK MEDICAL CARE IF: °· You have new problems because of your injuries. °· You have problems that may be because of the medicine you are taking, such as: °¨ Rash. °¨ Itching. °¨ Swelling. °¨ Trouble breathing. °· You develop belly (abdominal) pain, feel sick to your stomach (nausea) or are vomiting. °· You begin to run a temperature. °· You need supportive care or referral to a rape crisis center. These are centers with trained personnel who can help you get through this ordeal. °SEEK IMMEDIATE MEDICAL CARE IF: °· You are afraid of being threatened, beaten, or abused. In [place] call 911. °· You   receive new injuries related to abuse.  You develop severe pain in any area injured in the assault or have any change in your condition that concerns you.  You faint or lose consciousness.  You develop chest pain or shortness of breath. Document Released: 12/21/2004 Document Revised: 03/15/2011 Document Reviewed: 08/09/2007 [facility] Patient  Information 2015 [place], [place]. This information is not intended to replace advice given to you by your health care provider. Make sure you discuss any questions you have with your health care provider. Wrist Pain Wrist injuries are frequent in adults and children. A sprain is an injury to the ligaments that hold your bones together. A strain is an injury to muscle or muscle cord-like structures (tendons) from stretching or pulling. Generally, when wrists are moderately tender to touch following a fall or injury, a break in the bone (fracture) may be present. Most wrist sprains or strains are better in 3 to 5 days, but complete healing may take several weeks. HOME CARE INSTRUCTIONS   Put ice on the injured area.  Put ice in a plastic bag.  Place a towel between your skin and the bag.  Leave the ice on for 15-20 minutes, 3-4 times a day, for the first 2 days, or as directed by your health care provider.  Keep your arm raised above the level of your heart whenever possible to reduce swelling and pain.  Rest the injured area for at least 48 hours or as directed by your health care provider.  If a splint or elastic bandage has been applied, use it for as long as directed by your health care provider or until seen by a health care provider for a follow-up exam.  Only take over-the-counter or prescription medicines for pain, discomfort, or fever as directed by your health care provider.  Keep all follow-up appointments. You may need to follow up with a specialist or have follow-up X-rays. Improvement in pain level is not a guarantee that you did not fracture a bone in your wrist. The only way to determine whether or not you have a broken bone is by X-ray. SEEK IMMEDIATE MEDICAL CARE IF:   Your fingers are swollen, very red, white, or cold and blue.  Your fingers are numb or tingling.  You have increasing pain.  You have difficulty moving your fingers. MAKE SURE YOU:   Understand these  instructions.  Will watch your condition.  Will get help right away if you are not doing well or get worse. Document Released: 09/30/2004 Document Revised: 12/26/2012 Document Reviewed: 02/11/2010 [facility] Patient Information 2015 [place], [place]. This information is not intended to replace advice given to you by your health care provider. Make sure you discuss any questions you have with your health care provider.
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
## 2013-09-23 NOTE — ED Notes (Signed)
Pt speaking with [employer]
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
## 2013-09-23 NOTE — ED Notes (Signed)
Patient transported to X-ray 
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
## 2013-09-24 ENCOUNTER — Ambulatory Visit (INDEPENDENT_AMBULATORY_CARE_PROVIDER_SITE_OTHER): Payer: Medicaid Other | Admitting: Obstetrics & Gynecology

## 2013-09-24 DIAGNOSIS — N979 Female infertility, unspecified: Secondary | ICD-10-CM

## 2013-09-26 ENCOUNTER — Encounter: Payer: Self-pay | Admitting: Obstetrics & Gynecology

## 2013-09-26 NOTE — Progress Notes (Signed)
No show

## 2013-11-05 ENCOUNTER — Encounter: Payer: Self-pay | Admitting: Obstetrics & Gynecology

## 2013-12-17 ENCOUNTER — Encounter: Payer: Self-pay | Admitting: Obstetrics

## 2013-12-17 ENCOUNTER — Ambulatory Visit (INDEPENDENT_AMBULATORY_CARE_PROVIDER_SITE_OTHER): Payer: Medicaid Other | Admitting: Obstetrics

## 2013-12-17 VITALS — BP 116/79 | HR 82 | Temp 98.6°F | Ht 59.0 in | Wt 122.0 lb

## 2013-12-17 DIAGNOSIS — N839 Noninflammatory disorder of ovary, fallopian tube and broad ligament, unspecified: Secondary | ICD-10-CM

## 2013-12-17 DIAGNOSIS — N760 Acute vaginitis: Secondary | ICD-10-CM

## 2013-12-17 DIAGNOSIS — Z0000 Encounter for general adult medical examination without abnormal findings: Secondary | ICD-10-CM

## 2013-12-17 DIAGNOSIS — Z3169 Encounter for other general counseling and advice on procreation: Secondary | ICD-10-CM

## 2013-12-17 DIAGNOSIS — N643 Galactorrhea not associated with childbirth: Secondary | ICD-10-CM

## 2013-12-17 DIAGNOSIS — E039 Hypothyroidism, unspecified: Secondary | ICD-10-CM

## 2013-12-17 MED ORDER — OB COMPLETE PETITE 35-5-1-200 MG PO CAPS: 1.0000 | ORAL_CAPSULE | Freq: Every day | ORAL | Status: DC

## 2013-12-18 ENCOUNTER — Encounter: Payer: Self-pay | Admitting: Obstetrics

## 2013-12-18 LAB — PAP IG W/ RFLX HPV ASCU

## 2013-12-18 LAB — TSH: TSH: 3.612 u[IU]/mL (ref 0.350–4.500)

## 2013-12-18 LAB — PROLACTIN: Prolactin: 4 ng/mL

## 2013-12-18 NOTE — Progress Notes (Signed)
Subjective:     Tracie Garcia is a 28 y.o. female here for a routine exam.  Current complaints: Not able to get pregnant over the past 2 years.  Leaking breast milk.  H/O hypothyroidism. On Synthroid.    Personal health questionnaire:  Is patient Ashkenazi Jewish, have a family history of breast and/or ovarian cancer: no Is there a family history of uterine cancer diagnosed at age < 6250, gastrointestinal cancer, urinary tract cancer, family member who is a Personnel officerLynch syndrome-associated carrier: no Is the patient overweight and hypertensive, family history of diabetes, personal history of gestational diabetes or PCOS: no Is patient over 2755, have PCOS,  family history of premature CHD under age 27, diabetes, smoke, have hypertension or peripheral artery disease:  no At any time, has a partner hit, kicked or otherwise hurt or frightened you?: no Over the past 2 weeks, have you felt down, depressed or hopeless?: no Over the past 2 weeks, have you felt little interest or pleasure in doing things?:no   Gynecologic History Patient's last menstrual period was 11/25/2013. Contraception: none Last Pap: 2014. Results were: normal Last mammogram: n/a. Results were: n/a  Obstetric History OB History  Gravida Para Term Preterm AB SAB TAB Ectopic Multiple Living  5 2 2  2  2   2     # Outcome Date GA Lbr Len/2nd Weight Sex Delivery Anes PTL Lv  5 Term           4 Term           3 TAB           2 TAB           1 Gravida              Comments: System Generated. Please review and update pregnancy details.      Past Medical History  Diagnosis Date  . Abnormal Pap smear   . Gonorrhea   . Chlamydia     Past Surgical History  Procedure Laterality Date  . Colposcopy    . Dilation and curettage of uterus      tab x2  . Therapeutic abortion      Current outpatient prescriptions: levothyroxine (SYNTHROID, LEVOTHROID) 75 MCG tablet, Take 75 mcg by mouth daily. , Disp: , Rfl: ;   Prenat-FeCbn-FeAspGl-FA-Omega (OB COMPLETE PETITE) 35-5-1-200 MG CAPS, Take 1 capsule by mouth daily before breakfast., Disp: 90 capsule, Rfl: 3 No Known Allergies  History  Substance Use Topics  . Smoking status: Current Some Day Smoker -- 0.25 packs/day    Types: Cigarettes  . Smokeless tobacco: Never Used  . Alcohol Use: 0.0 oz/week    0 Not specified per week     Comment: socially    Family History  Problem Relation Age of Onset  . Adopted: Yes      Review of Systems  Constitutional: negative for fatigue and weight loss Respiratory: negative for cough and wheezing Cardiovascular: negative for chest pain, fatigue and palpitations Gastrointestinal: negative for abdominal pain and change in bowel habits Musculoskeletal:negative for myalgias Neurological: negative for gait problems and tremors Behavioral/Psych: negative for abusive relationship, depression Endocrine: negative for temperature intolerance   Genitourinary:negative for abnormal menstrual periods, genital lesions, hot flashes, sexual problems and vaginal discharge Integument/breast: negative for breast lump, breast tenderness, nipple discharge and skin lesion(s)    Objective:       BP 116/79 mmHg  Pulse 82  Temp(Src) 98.6 F (37 C)  Ht 4\' 11"  (1.499  m)  Wt 122 lb (55.339 kg)  BMI 24.63 kg/m2  LMP 11/25/2013 General:   alert  Skin:   no rash or abnormalities  Lungs:   clear to auscultation bilaterally  Heart:   regular rate and rhythm, S1, S2 normal, no murmur, click, rub or gallop  Breasts:   normal without suspicious masses, skin or nipple changes or axillary nodes  Abdomen:  normal findings: no organomegaly, soft, non-tender and no hernia  Pelvis:  External genitalia: normal general appearance Urinary system: urethral meatus normal and bladder without fullness, nontender Vaginal: normal without tenderness, induration or masses Cervix: normal appearance Adnexa: normal bimanual exam Uterus: anteverted  and non-tender, normal size   Lab Review Urine pregnancy test Labs reviewed yes Radiologic studies reviewed yes    Assessment:    Healthy female exam.    Hypothyroidism  Ovulation Disorder  Galactorrhea    Plan:    Education reviewed: low fat, low cholesterol diet, safe sex/STD prevention and weight bearing exercise. Follow up in: 4 weeks. TSH and Prolactin ordered   Meds ordered this encounter  Medications  . Prenat-FeCbn-FeAspGl-FA-Omega (OB COMPLETE PETITE) 35-5-1-200 MG CAPS    Sig: Take 1 capsule by mouth daily before breakfast.    Dispense:  90 capsule    Refill:  3   Orders Placed This Encounter  Procedures  . SureSwab, Vaginosis/Vaginitis Plus  . TSH  . Prolactin

## 2013-12-20 ENCOUNTER — Telehealth: Payer: Self-pay | Admitting: *Deleted

## 2013-12-20 ENCOUNTER — Other Ambulatory Visit: Payer: Self-pay | Admitting: Obstetrics

## 2013-12-20 DIAGNOSIS — B9689 Other specified bacterial agents as the cause of diseases classified elsewhere: Secondary | ICD-10-CM

## 2013-12-20 DIAGNOSIS — A5901 Trichomonal vulvovaginitis: Secondary | ICD-10-CM

## 2013-12-20 DIAGNOSIS — N760 Acute vaginitis: Secondary | ICD-10-CM

## 2013-12-20 LAB — SURESWAB, VAGINOSIS/VAGINITIS PLUS
Atopobium vaginae: 6.7 Log (cells/mL)
C. ALBICANS, DNA: NOT DETECTED
C. GLABRATA, DNA: NOT DETECTED
C. PARAPSILOSIS, DNA: NOT DETECTED
C. TROPICALIS, DNA: NOT DETECTED
C. trachomatis RNA, TMA: NOT DETECTED
GARDNERELLA VAGINALIS: 7.6 Log (cells/mL)
LACTOBACILLUS SPECIES: NOT DETECTED Log (cells/mL)
MEGASPHAERA SPECIES: 7.3 Log (cells/mL)
N. gonorrhoeae RNA, TMA: NOT DETECTED
T. VAGINALIS RNA, QL TMA: DETECTED — AB

## 2013-12-20 MED ORDER — TINIDAZOLE 500 MG PO TABS: 2.0000 g | ORAL_TABLET | Freq: Every day | ORAL | Status: DC

## 2013-12-20 MED ORDER — METRONIDAZOLE 0.75 % VA GEL: 1.0000 | Freq: Two times a day (BID) | VAGINAL | Status: DC

## 2013-12-20 NOTE — Telephone Encounter (Signed)
Patient called for test results- patient notified of results and she wants to know what is the next step for pregnancy.Told patient would check with the provider and call her back.

## 2013-12-30 ENCOUNTER — Encounter (HOSPITAL_COMMUNITY): Payer: Self-pay | Admitting: *Deleted

## 2013-12-30 DIAGNOSIS — Z9889 Other specified postprocedural states: Secondary | ICD-10-CM | POA: Insufficient documentation

## 2013-12-30 DIAGNOSIS — Z8619 Personal history of other infectious and parasitic diseases: Secondary | ICD-10-CM | POA: Insufficient documentation

## 2013-12-30 DIAGNOSIS — Z79899 Other long term (current) drug therapy: Secondary | ICD-10-CM | POA: Insufficient documentation

## 2013-12-30 DIAGNOSIS — R197 Diarrhea, unspecified: Secondary | ICD-10-CM | POA: Diagnosis not present

## 2013-12-30 DIAGNOSIS — Z792 Long term (current) use of antibiotics: Secondary | ICD-10-CM | POA: Insufficient documentation

## 2013-12-30 DIAGNOSIS — R112 Nausea with vomiting, unspecified: Secondary | ICD-10-CM | POA: Diagnosis not present

## 2013-12-30 DIAGNOSIS — Z720 Tobacco use: Secondary | ICD-10-CM | POA: Diagnosis not present

## 2013-12-30 DIAGNOSIS — R1084 Generalized abdominal pain: Secondary | ICD-10-CM | POA: Insufficient documentation

## 2013-12-30 DIAGNOSIS — Z3202 Encounter for pregnancy test, result negative: Secondary | ICD-10-CM | POA: Diagnosis not present

## 2013-12-30 NOTE — ED Notes (Signed)
The pot has had abd pain since Thursday with n v and diarrhea.  lmp  Dec 21

## 2013-12-30 NOTE — ED Notes (Signed)
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

## 2013-12-31 ENCOUNTER — Encounter: Payer: Self-pay | Admitting: *Deleted

## 2013-12-31 ENCOUNTER — Emergency Department (HOSPITAL_COMMUNITY)
Admission: EM | Admit: 2013-12-31 | Discharge: 2013-12-31 | Disposition: A | Discharge status: Home / Self Care | Payer: Medicaid Other | Attending: Emergency Medicine | Admitting: Emergency Medicine

## 2013-12-31 DIAGNOSIS — R1110 Vomiting, unspecified: Secondary | ICD-10-CM

## 2013-12-31 DIAGNOSIS — R109 Unspecified abdominal pain: Secondary | ICD-10-CM

## 2013-12-31 DIAGNOSIS — R197 Diarrhea, unspecified: Secondary | ICD-10-CM

## 2013-12-31 LAB — URINALYSIS, ROUTINE W REFLEX MICROSCOPIC
Bilirubin Urine: NEGATIVE
GLUCOSE, UA: NEGATIVE mg/dL
HGB URINE DIPSTICK: NEGATIVE
Ketones, ur: NEGATIVE mg/dL
Nitrite: NEGATIVE
Protein, ur: NEGATIVE mg/dL
Specific Gravity, Urine: 1.016 (ref 1.005–1.030)
Urobilinogen, UA: 0.2 mg/dL (ref 0.0–1.0)
pH: 6 (ref 5.0–8.0)

## 2013-12-31 LAB — CBC WITH DIFFERENTIAL/PLATELET
BASOS ABS: 0 10*3/uL (ref 0.0–0.1)
BASOS PCT: 0 % (ref 0–1)
Eosinophils Absolute: 0.1 10*3/uL (ref 0.0–0.7)
Eosinophils Relative: 1 % (ref 0–5)
HEMATOCRIT: 31.7 % — AB (ref 36.0–46.0)
HEMOGLOBIN: 11.1 g/dL — AB (ref 12.0–15.0)
LYMPHS PCT: 31 % (ref 12–46)
Lymphs Abs: 2.7 10*3/uL (ref 0.7–4.0)
MCH: 30.2 pg (ref 26.0–34.0)
MCHC: 35 g/dL (ref 30.0–36.0)
MCV: 86.1 fL (ref 78.0–100.0)
MONOS PCT: 8 % (ref 3–12)
Monocytes Absolute: 0.7 10*3/uL (ref 0.1–1.0)
NEUTROS ABS: 5.4 10*3/uL (ref 1.7–7.7)
Neutrophils Relative %: 60 % (ref 43–77)
Platelets: 242 10*3/uL (ref 150–400)
RBC: 3.68 MIL/uL — ABNORMAL LOW (ref 3.87–5.11)
RDW: 12.5 % (ref 11.5–15.5)
WBC: 8.9 10*3/uL (ref 4.0–10.5)

## 2013-12-31 LAB — COMPREHENSIVE METABOLIC PANEL
ALBUMIN: 3.9 g/dL (ref 3.5–5.2)
ALK PHOS: 62 U/L (ref 39–117)
ALT: 10 U/L (ref 0–35)
AST: 17 U/L (ref 0–37)
Anion gap: 2 — ABNORMAL LOW (ref 5–15)
CHLORIDE: 106 meq/L (ref 96–112)
CO2: 28 mmol/L (ref 19–32)
Calcium: 8.9 mg/dL (ref 8.4–10.5)
Creatinine, Ser: 0.65 mg/dL (ref 0.50–1.10)
GFR calc Af Amer: >90 mL/min (ref >90)
Glucose, Bld: 103 mg/dL — ABNORMAL HIGH (ref 70–99)
POTASSIUM: 3.4 mmol/L — AB (ref 3.5–5.1)
Sodium: 136 mmol/L (ref 135–145)
Total Bilirubin: 0.4 mg/dL (ref 0.3–1.2)
Total Protein: 6.9 g/dL (ref 6.0–8.3)

## 2013-12-31 LAB — URINE MICROSCOPIC-ADD ON

## 2013-12-31 LAB — LIPASE, BLOOD: Lipase: 27 U/L (ref 11–59)

## 2013-12-31 LAB — PREGNANCY, URINE: Preg Test, Ur: NEGATIVE

## 2013-12-31 MED ORDER — ONDANSETRON 4 MG PO TBDP: ORAL_TABLET | ORAL | Status: DC

## 2013-12-31 MED ORDER — DICYCLOMINE HCL 20 MG PO TABS: 20.0000 mg | ORAL_TABLET | Freq: Two times a day (BID) | ORAL | Status: DC | PRN

## 2013-12-31 MED ORDER — SODIUM CHLORIDE 0.9 % IV BOLUS (SEPSIS)
2000.0000 mL | Freq: Once | INTRAVENOUS | Status: AC
  Administered 2013-12-31: 2000 mL via INTRAVENOUS

## 2013-12-31 MED ORDER — DICYCLOMINE HCL 10 MG PO CAPS
10.0000 mg | ORAL_CAPSULE | Freq: Once | ORAL | Status: AC
  Administered 2013-12-31: 10 mg via ORAL
  Filled 2013-12-31: qty 1

## 2013-12-31 MED ORDER — ONDANSETRON HCL 4 MG/2ML IJ SOLN
4.0000 mg | Freq: Once | INTRAMUSCULAR | Status: AC
  Administered 2013-12-31: 4 mg via INTRAVENOUS
  Filled 2013-12-31: qty 2

## 2013-12-31 NOTE — ED Notes (Signed)
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

## 2013-12-31 NOTE — ED Notes (Signed)
Called pharmacy, Fayrene FearingJames will have bentyl sent.

## 2013-12-31 NOTE — Discharge Instructions (Signed)
Abdominal Pain, Women °Abdominal (stomach, pelvic, or belly) pain can be caused by many things. It is important to tell your doctor: °· The location of the pain. °· Does it come and go or is it present all the time? °· Are there things that start the pain (eating certain foods, exercise)? °· Are there other symptoms associated with the pain (fever, nausea, vomiting, diarrhea)? °All of this is helpful to know when trying to find the cause of the pain. °CAUSES  °· Stomach: virus or bacteria infection, or ulcer. °· Intestine: appendicitis (inflamed appendix), regional ileitis (Crohn's disease), ulcerative colitis (inflamed colon), irritable bowel syndrome, diverticulitis (inflamed diverticulum of the colon), or cancer of the stomach or intestine. °· Gallbladder disease or stones in the gallbladder. °· Kidney disease, kidney stones, or infection. °· Pancreas infection or cancer. °· Fibromyalgia (pain disorder). °· Diseases of the female organs: °· Uterus: fibroid (non-cancerous) tumors or infection. °· Fallopian tubes: infection or tubal pregnancy. °· Ovary: cysts or tumors. °· Pelvic adhesions (scar tissue). °· Endometriosis (uterus lining tissue growing in the pelvis and on the pelvic organs). °· Pelvic congestion syndrome (female organs filling up with blood just before the menstrual period). °· Pain with the menstrual period. °· Pain with ovulation (producing an egg). °· Pain with an IUD (intrauterine device, birth control) in the uterus. °· Cancer of the female organs. °· Functional pain (pain not caused by a disease, may improve without treatment). °· Psychological pain. °· Depression. °DIAGNOSIS  °Your doctor will decide the seriousness of your pain by doing an examination. °· Blood tests. °· X-rays. °· Ultrasound. °· CT scan (computed tomography, special type of X-ray). °· MRI (magnetic resonance imaging). °· Cultures, for infection. °· Barium enema (dye inserted in the large intestine, to better view it with  X-rays). °· Colonoscopy (looking in intestine with a lighted tube). °· Laparoscopy (minor surgery, looking in abdomen with a lighted tube). °· Major abdominal exploratory surgery (looking in abdomen with a large incision). °TREATMENT  °The treatment will depend on the cause of the pain.  °· Many cases can be observed and treated at home. °· Over-the-counter medicines recommended by your caregiver. °· Prescription medicine. °· Antibiotics, for infection. °· Birth control pills, for painful periods or for ovulation pain. °· Hormone treatment, for endometriosis. °· Nerve blocking injections. °· Physical therapy. °· Antidepressants. °· Counseling with a psychologist or psychiatrist. °· Minor or major surgery. °HOME CARE INSTRUCTIONS  °· Do not take laxatives, unless directed by your caregiver. °· Take over-the-counter pain medicine only if ordered by your caregiver. Do not take aspirin because it can cause an upset stomach or bleeding. °· Try a clear liquid diet (broth or water) as ordered by your caregiver. Slowly move to a bland diet, as tolerated, if the pain is related to the stomach or intestine. °· Have a thermometer and take your temperature several times a day, and record it. °· Bed rest and sleep, if it helps the pain. °· Avoid sexual intercourse, if it causes pain. °· Avoid stressful situations. °· Keep your follow-up appointments and tests, as your caregiver orders. °· If the pain does not go away with medicine or surgery, you may try: °· Acupuncture. °· Relaxation exercises (yoga, meditation). °· Group therapy. °· Counseling. °SEEK MEDICAL CARE IF:  °· You notice certain foods cause stomach pain. °· Your home care treatment is not helping your pain. °· You need stronger pain medicine. °· You want your IUD removed. °· You feel faint or   lightheaded. °· You develop nausea and vomiting. °· You develop a rash. °· You are having side effects or an allergy to your medicine. °SEEK IMMEDIATE MEDICAL CARE IF:  °· Your  pain does not go away or gets worse. °· You have a fever. °· Your pain is felt only in portions of the abdomen. The right side could possibly be appendicitis. The left lower portion of the abdomen could be colitis or diverticulitis. °· You are passing blood in your stools (bright red or black tarry stools, with or without vomiting). °· You have blood in your urine. °· You develop chills, with or without a fever. °· You pass out. °MAKE SURE YOU:  °· Understand these instructions. °· Will watch your condition. °· Will get help right away if you are not doing well or get worse. °Document Released: 10/18/2006 Document Revised: 05/07/2013 Document Reviewed: 11/07/2008 °ExitCare® Patient Information ©2015 [employer]. This information is not intended to replace advice given to you by your health care provider. Make sure you discuss any questions you have with your health care provider. ° °Nausea and Vomiting °Nausea is a sick feeling that often comes before throwing up (vomiting). Vomiting is a reflex where stomach contents come out of your mouth. Vomiting can cause severe loss of body fluids (dehydration). Children and elderly adults can become dehydrated quickly, especially if they also have diarrhea. Nausea and vomiting are symptoms of a condition or disease. It is important to find the cause of your symptoms. °CAUSES  °· Direct irritation of the stomach lining. This irritation can result from increased acid production (gastroesophageal reflux disease), infection, food poisoning, taking certain medicines (such as nonsteroidal anti-inflammatory drugs), alcohol use, or tobacco use. °· Signals from the brain. These signals could be caused by a headache, heat exposure, an inner ear disturbance, increased pressure in the brain from injury, infection, a tumor, or a concussion, pain, emotional stimulus, or metabolic problems. °· An obstruction in the gastrointestinal tract (bowel obstruction). °· Illnesses such as diabetes,  hepatitis, gallbladder problems, appendicitis, kidney problems, cancer, sepsis, atypical symptoms of a heart attack, or eating disorders. °· Medical treatments such as chemotherapy and radiation. °· Receiving medicine that makes you sleep (general anesthetic) during surgery. °DIAGNOSIS °Your caregiver may ask for tests to be done if the problems do not improve after a few days. Tests may also be done if symptoms are severe or if the reason for the nausea and vomiting is not clear. Tests may include: °· Urine tests. °· Blood tests. °· Stool tests. °· Cultures (to look for evidence of infection). °· X-rays or other imaging studies. °Test results can help your caregiver make decisions about treatment or the need for additional tests. °TREATMENT °You need to stay well hydrated. Drink frequently but in small amounts. You may wish to drink water, sports drinks, clear broth, or eat frozen ice pops or gelatin dessert to help stay hydrated. When you eat, eating slowly may help prevent nausea. There are also some antinausea medicines that may help prevent nausea. °HOME CARE INSTRUCTIONS  °· Take all medicine as directed by your caregiver. °· If you do not have an appetite, do not force yourself to eat. However, you must continue to drink fluids. °· If you have an appetite, eat a normal diet unless your caregiver tells you differently. °¨ Eat a variety of complex carbohydrates (rice, wheat, potatoes, bread), lean meats, yogurt, fruits, and vegetables. °¨ Avoid high-fat foods because they are more difficult to digest. °· Drink enough water and fluids   to keep your urine clear or pale yellow. °· If you are dehydrated, ask your caregiver for specific rehydration instructions. Signs of dehydration may include: °¨ Severe thirst. °¨ Dry lips and mouth. °¨ Dizziness. °¨ Dark urine. °¨ Decreasing urine frequency and amount. °¨ Confusion. °¨ Rapid breathing or pulse. °SEEK IMMEDIATE MEDICAL CARE IF:  °· You have blood or brown flecks  (like coffee grounds) in your vomit. °· You have black or bloody stools. °· You have a severe headache or stiff neck. °· You are confused. °· You have severe abdominal pain. °· You have chest pain or trouble breathing. °· You do not urinate at least once every 8 hours. °· You develop cold or clammy skin. °· You continue to vomit for longer than 24 to 48 hours. °· You have a fever. °MAKE SURE YOU:  °· Understand these instructions. °· Will watch your condition. °· Will get help right away if you are not doing well or get worse. °Document Released: 12/21/2004 Document Revised: 03/15/2011 Document Reviewed: 05/20/2010 °ExitCare® Patient Information ©2015 [employer]. This information is not intended to replace advice given to you by your health care provider. Make sure you discuss any questions you have with your health care provider. ° °

## 2013-12-31 NOTE — ED Notes (Signed)
Reported improved pain to Dr. Ranae PalmsYelverton. MD acknowledges, no new orders at this time.

## 2013-12-31 NOTE — ED Notes (Signed)
Discussed plan of care with Dr. Ranae PalmsYelverton, reported pain in stomach and request for medication. MD acknowledges, gives verbal order for 10 of bentanyl.

## 2013-12-31 NOTE — ED Notes (Signed)
Explained delay and apologized for wait time.

## 2013-12-31 NOTE — ED Provider Notes (Signed)
CSN: [id number]     Arrival date & time 12/30/13  2338 History  This chart was scribed for Loren Raceravid Zlata Alcaide, MD by Roxy Cedarhandni Bhalodia, ED Scribe. This patient was seen in room B14C/B14C and the patient's care was started at 2:01 AM.   Chief Complaint  Patient presents with  . Abdominal Pain   Patient is a 28 y.o. female presenting with abdominal pain. The history is provided by the patient. No language interpreter was used.  Abdominal Pain Pain location:  Generalized Pain quality: aching and cramping   Pain radiates to:  Does not radiate Pain severity:  Moderate Onset quality:  Gradual Duration:  4 days Timing:  Constant Progression:  Waxing and waning Chronicity:  New Relieved by:  Nothing Worsened by:  Nothing tried Ineffective treatments:  Liquids, eating and OTC medications Associated symptoms: diarrhea, nausea and vomiting   Associated symptoms: no chest pain, no chills, no constipation, no dysuria, no fever, no shortness of breath, no vaginal bleeding and no vaginal discharge     HPI Comments: Tracie Garcia is a 28 y.o. female with presents to the Emergency Department complaining of moderate, cramping abdominal pain that began 4 days ago. She reports associated nausea, 12 episodes of emesis and multiple episodes of watery diarrhea that also began 4 days ago. LNMP was earlier this month. Her last meal before onset of symptoms was oodles of noodles. Patient denies any urinary symptoms. She's had no vaginal bleeding or discharge. She's had no fever or chills. 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  Past Medical History  Diagnosis Date  . Abnormal Pap smear   . Gonorrhea   . Chlamydia    Past Surgical History  Procedure Laterality Date  . Colposcopy    . Dilation and curettage of uterus      tab x2  . Therapeutic abortion     Family History  Problem Relation Age of Onset  . Adopted: Yes   History  Substance Use Topics  . Smoking status: Current  Some Day Smoker -- 0.25 packs/day    Types: Cigarettes  . Smokeless tobacco: Never Used  . Alcohol Use: 0.0 oz/week    0 Not specified per week     Comment: socially   OB History    Gravida Para Term Preterm AB TAB SAB Ectopic Multiple Living   5 2 2  2 2    2      Review of Systems  Constitutional: Negative for fever and chills.  Respiratory: Negative for shortness of breath.   Cardiovascular: Negative for chest pain.  Gastrointestinal: Positive for nausea, vomiting, abdominal pain and diarrhea. Negative for constipation and blood in stool.  Genitourinary: Negative for dysuria, flank pain, vaginal bleeding, vaginal discharge and pelvic pain.  Musculoskeletal: Negative for back pain and neck stiffness.  Skin: Negative for rash and wound.  Neurological: Negative for dizziness, weakness, light-headedness, numbness and headaches.  All other systems reviewed and are negative.  Allergies  Review of patient's allergies indicates no known allergies.  Home Medications   Prior to Admission medications   Medication Sig Start Date End Date Taking? Authorizing Provider  levothyroxine (SYNTHROID, LEVOTHROID) 75 MCG tablet Take 75 mcg by mouth daily.    Yes Historical Provider, MD  Prenat-FeCbn-FeAspGl-FA-Omega (OB COMPLETE PETITE) 35-5-1-200 MG CAPS Take 1 capsule by mouth daily before breakfast. 12/17/13  Yes Brock Badharles A Harper, MD  dicyclomine (BENTYL) 20 MG tablet Take 1 tablet (20 mg total) by mouth 2 (two) times  daily as needed for spasms. 12/31/13   Loren Raceravid Catelyn Friel, MD  metroNIDAZOLE (FLAGYL) 500 MG tablet Take 4 tablets (2,000 mg total) by mouth daily with breakfast. 01/02/14   Brock Badharles A Harper, MD  metroNIDAZOLE (METROGEL VAGINAL) 0.75 % vaginal gel Place 1 Applicatorful vaginally 2 (two) times daily. Patient not taking: Reported on 12/31/2013 12/20/13   Brock Badharles A Harper, MD  ondansetron (ZOFRAN ODT) 4 MG disintegrating tablet 4mg  ODT q4 hours prn nausea/vomit 12/31/13   Loren Raceravid Tionne Carelli,  MD  tinidazole [facility](TINDAMAX) 500 MG tablet Take 4 tablets (2,000 mg total) by mouth daily with breakfast. 01/02/14   Brock Badharles A Harper, MD   Triage Vitals: BP 104/60 mmHg  Pulse 56  Temp(Src) 97.9 F (36.6 C)  Resp 19  Ht 4\' 11"  (1.499 m)  Wt 121 lb (54.885 kg)  BMI 24.43 kg/m2  SpO2 98%  LMP 12/24/2013  Physical Exam  Constitutional: She is oriented to person, place, and time. She appears well-developed and well-nourished. No distress.  HENT:  Head: Normocephalic and atraumatic.  Mouth/Throat: Oropharynx is clear and moist. No oropharyngeal exudate.  Eyes: Conjunctivae and EOM are normal. Pupils are equal, round, and reactive to light.  Neck: Normal range of motion. Neck supple. No tracheal deviation present.  Cardiovascular: Normal rate and regular rhythm.   Pulmonary/Chest: Effort normal and breath sounds normal. No respiratory distress. She has no wheezes. She has no rales. She exhibits no tenderness.  Abdominal: Soft. She exhibits no mass. There is tenderness (mild diffuse tenderness without focality.). There is no rebound and no guarding.  Increased bowel sounds throughout.  Musculoskeletal: Normal range of motion. She exhibits no edema or tenderness.  No CVA tenderness bilaterally.  Neurological: She is alert and oriented to person, place, and time.  Physical extremities without deficit. Sensation grossly intact.  Skin: Skin is warm and dry. No rash noted. No erythema.  Psychiatric: She has a normal mood and affect. Her behavior is normal.  Nursing note and vitals reviewed.  ED Course  Procedures (including critical care time)  DIAGNOSTIC STUDIES: Oxygen Saturation is 98% on RA, normal by my interpretation.    COORDINATION OF CARE: 2:04 AM- Discussed plans to obtain diagnostic lab work. Will give patient Zofran and IV fluids. Pt advised of plan for treatment and pt agrees.  Labs Review Labs Reviewed  CBC WITH DIFFERENTIAL - Abnormal; Notable for the following:    RBC  3.68 (*)    Hemoglobin 11.1 (*)    HCT 31.7 (*)    All other components within normal limits  COMPREHENSIVE METABOLIC PANEL - Abnormal; Notable for the following:    Potassium 3.4 (*)    Glucose, Bld 103 (*)    BUN <5 (*)    Anion gap 2 (*)    All other components within normal limits  URINALYSIS, ROUTINE W REFLEX MICROSCOPIC - Abnormal; Notable for the following:    Leukocytes, UA TRACE (*)    All other components within normal limits  URINE MICROSCOPIC-ADD ON - Abnormal; Notable for the following:    Squamous Epithelial / LPF FEW (*)    All other components within normal limits  LIPASE, BLOOD  PREGNANCY, URINE   Imaging Review No results found.   EKG Interpretation None     MDM   Final diagnoses:  Abdominal cramping  Vomiting and diarrhea    Patient is very well-appearing and has a benign abdominal exam. We'll give IV fluid and treat symptomatically. Next  States she is feeling better. Normal white  blood cell count. Electrolytes normal limits. A prescription for Zofran and Bentyl and advised follow-up with primary doctor. She's been given return precautions and has voice understanding.  I personally performed the services described in this documentation, which was scribed in my presence. The recorded information has been reviewed and is accurate.  Loren Racer, MD 01/03/14 1141

## 2014-01-01 ENCOUNTER — Encounter: Payer: Self-pay | Admitting: Obstetrics & Gynecology

## 2014-01-02 ENCOUNTER — Telehealth: Payer: Self-pay | Admitting: *Deleted

## 2014-01-02 ENCOUNTER — Other Ambulatory Visit: Payer: Self-pay | Admitting: *Deleted

## 2014-01-02 DIAGNOSIS — B9689 Other specified bacterial agents as the cause of diseases classified elsewhere: Secondary | ICD-10-CM

## 2014-01-02 DIAGNOSIS — A5901 Trichomonal vulvovaginitis: Secondary | ICD-10-CM

## 2014-01-02 DIAGNOSIS — N760 Acute vaginitis: Secondary | ICD-10-CM

## 2014-01-02 MED ORDER — METRONIDAZOLE 500 MG PO TABS: 2000.0000 mg | ORAL_TABLET | Freq: Every day | ORAL | Status: DC

## 2014-01-02 MED ORDER — TINIDAZOLE 500 MG PO TABS: 2.0000 g | ORAL_TABLET | Freq: Every day | ORAL | Status: DC

## 2014-01-02 NOTE — Telephone Encounter (Signed)
Pt called to office regarding pap results and medication not covered by insurance. Return call to pt.  Left message on voicemail making pt aware that medication change had been sent to pharmacy and that pt may contact office with any other concerns.

## 2014-01-02 NOTE — Progress Notes (Signed)
Medication not covered by pt ins.   Metronidazole was sent to pharmacy.

## 2014-01-10 ENCOUNTER — Telehealth: Payer: Self-pay | Admitting: *Deleted

## 2014-01-10 NOTE — Telephone Encounter (Signed)
Patient left message requesting a call back. Attempted to contact patient and left message for patient to contact the office.

## 2014-01-18 NOTE — Telephone Encounter (Signed)
No return call from pt.  Call closed.

## 2014-01-21 NOTE — Telephone Encounter (Signed)
Attempted to contact the patient and left message with a female for the patient to call back.

## 2014-01-28 ENCOUNTER — Encounter: Payer: Self-pay | Admitting: *Deleted

## 2014-01-28 NOTE — Progress Notes (Signed)
Several attempts have been made to contact pt regarding lab results and to verify medication was taken.  Letter to be mailed for pt to make office aware if she still needs medication.

## 2014-01-31 ENCOUNTER — Telehealth: Payer: Self-pay | Admitting: *Deleted

## 2014-01-31 NOTE — Telephone Encounter (Signed)
Pt called to office stating that she has received letter in mail and would like a call back.  Return call to pt.  Pt wanted to check medication that she had gotten from pharmacy.  Pt was made aware of medications and indications for each.  Pt states that she needs follow up for infertility and recent lab work.   Pt made aware that we can check schedule and call her with appt. Pt states understanding.

## 2014-02-01 ENCOUNTER — Telehealth: Payer: Self-pay | Admitting: Obstetrics

## 2014-02-07 NOTE — Telephone Encounter (Signed)
[id number] - patient scheduled appt for Friday [id number]. b rm

## 2014-02-08 ENCOUNTER — Encounter: Payer: Self-pay | Admitting: Obstetrics

## 2014-02-08 ENCOUNTER — Ambulatory Visit (INDEPENDENT_AMBULATORY_CARE_PROVIDER_SITE_OTHER): Payer: Medicaid Other | Admitting: Obstetrics

## 2014-02-08 VITALS — BP 114/66 | HR 74 | Temp 97.3°F | Ht 59.0 in | Wt 121.0 lb

## 2014-02-08 DIAGNOSIS — G4700 Insomnia, unspecified: Secondary | ICD-10-CM

## 2014-02-08 DIAGNOSIS — F17200 Nicotine dependence, unspecified, uncomplicated: Secondary | ICD-10-CM

## 2014-02-08 DIAGNOSIS — F32A Depression, unspecified: Secondary | ICD-10-CM

## 2014-02-08 DIAGNOSIS — F329 Major depressive disorder, single episode, unspecified: Secondary | ICD-10-CM

## 2014-02-08 DIAGNOSIS — N839 Noninflammatory disorder of ovary, fallopian tube and broad ligament, unspecified: Secondary | ICD-10-CM

## 2014-02-08 MED ORDER — BUPROPION HCL ER (SR) 150 MG PO TB12: ORAL_TABLET | ORAL | Status: DC

## 2014-02-08 MED ORDER — CLOMIPHENE CITRATE 50 MG PO TABS: ORAL_TABLET | ORAL | Status: DC

## 2014-02-08 MED ORDER — ZOLPIDEM TARTRATE 5 MG PO TABS: 5.0000 mg | ORAL_TABLET | Freq: Every evening | ORAL | Status: DC | PRN

## 2014-02-08 NOTE — Progress Notes (Addendum)
Patient ID: Tracie Garcia, female   DOB: 02-18-1985, 29 y.o.   MRN: [id number]  Chief Complaint  Patient presents with  . Follow-up    HPI Tracie Garcia is a 29 y.o. female.  Patient trying to get pregnant for 2 years without success.  Partner is proven stud.  Menstrual cycles regular and normal.  She admits to being depressed over the situation.  She does not sleep well.  Has a tobacco dependence that she would like to eliminate before pregnancy. HPI  Past Medical History  Diagnosis Date  . Abnormal Pap smear   . Gonorrhea   . Chlamydia     Past Surgical History  Procedure Laterality Date  . Colposcopy    . Dilation and curettage of uterus      tab x2  . Therapeutic abortion      Family History  Problem Relation Age of Onset  . Adopted: Yes    Social History History  Substance Use Topics  . Smoking status: Current Some Day Smoker -- 0.25 packs/day    Types: Cigarettes  . Smokeless tobacco: Never Used  . Alcohol Use: 0.0 oz/week    0 Not specified per week     Comment: socially    No Known Allergies  Current Outpatient Prescriptions  Medication Sig Dispense Refill  . levothyroxine (SYNTHROID, LEVOTHROID) 75 MCG tablet Take 75 mcg by mouth daily.     . Prenat-FeCbn-FeAspGl-FA-Omega (OB COMPLETE PETITE) 35-5-1-200 MG CAPS Take 1 capsule by mouth daily before breakfast. 90 capsule 3  . buPROPion (WELLBUTRIN SR) 150 MG 12 hr tablet Take 1 tablet po qam for 7 days, then increase to 1 po bid.  Take last dose no later than 5pm. 30 tablet 11  . clomiPHENE (CLOMID) 50 MG tablet Take 1 tablet po daily, starting day 5 of menstrual cycle. 5 tablet 1  . dicyclomine (BENTYL) 20 MG tablet Take 1 tablet (20 mg total) by mouth 2 (two) times daily as needed for spasms. (Patient not taking: Reported on 02/08/2014) 20 tablet 0  . zolpidem (AMBIEN) 5 MG tablet Take 1 tablet (5 mg total) by mouth at bedtime as needed for sleep. 30 tablet 5   No current facility-administered  medications for this visit.    Review of Systems Review of Systems Constitutional: negative for fatigue and weight loss.  Positive for insomnia. Respiratory: negative for cough and wheezing Cardiovascular: negative for chest pain, fatigue and palpitations Gastrointestinal: negative for abdominal pain and change in bowel habits Genitourinary:negative Integument/breast: negative for nipple discharge Musculoskeletal:negative for myalgias Neurological: negative for gait problems and tremors Behavioral/Psych: negative for abusive relationship.  Positive for depression Endocrine: negative for temperature intolerance     Blood pressure 114/66, pulse 74, temperature 97.3 F (36.3 C), height  (1.499 m), weight 121 lb (54.885 kg), last menstrual period 01/17/2014.  Physical Exam  Physical Exam:  Deferred  100% of 30 min visit spent on counseling and coordination of care.   Data Reviewed Labs  Assessment     Ovulation disorder  Anxiety / Depression Tobacco dependence Insomnia    Plan    Clomid Rx Wellbutrin Rx for both depression and tobacco dependence Ambien Rx F/U 4 months   No orders of the defined types were placed in this encounter.   Meds ordered this encounter  Medications  . clomiPHENE (CLOMID) 50 MG tablet    Sig: Take 1 tablet po daily, starting day 5 of menstrual cycle.    Dispense:  5  tablet    Refill:  1  . buPROPion (WELLBUTRIN SR) 150 MG 12 hr tablet    Sig: Take 1 tablet po qam for 7 days, then increase to 1 po bid.  Take last dose no later than 5pm.    Dispense:  30 tablet    Refill:  11  . zolpidem (AMBIEN) 5 MG tablet    Sig: Take 1 tablet (5 mg total) by mouth at bedtime as needed for sleep.    Dispense:  30 tablet    Refill:  5

## 2014-03-12 ENCOUNTER — Other Ambulatory Visit (INDEPENDENT_AMBULATORY_CARE_PROVIDER_SITE_OTHER): Payer: Medicaid Other

## 2014-03-12 VITALS — BP 105/70 | HR 76 | Temp 97.8°F | Ht 59.0 in | Wt 124.0 lb

## 2014-03-12 DIAGNOSIS — Z3200 Encounter for pregnancy test, result unknown: Secondary | ICD-10-CM | POA: Diagnosis not present

## 2014-03-12 LAB — POCT URINE PREGNANCY: Preg Test, Ur: POSITIVE

## 2014-03-12 NOTE — Progress Notes (Signed)
Patient in the office for a pregnancy test. Patient believes she may be pregnant.Patient is having some nausea. Pregnancy Test in office is positive. Patient states this is intended pregnancy. Patient plans on continuing the pregnancy. Patient encouraged to schedule a NOB appointment and to start prenatal vitamins. Patient instructed to use [facility] for any emergent needs. No show Policy reviewed. Patient verbalized understanding.   BP 105/70 mmHg  Pulse 76  Temp(Src) 97.8 F (36.6 C)  Ht 4\' 11"  (1.499 m)  Wt 124 lb (56.246 kg)  BMI 25.03 kg/m2  LMP 02/11/2014

## 2014-03-14 ENCOUNTER — Other Ambulatory Visit: Payer: Medicaid Other

## 2014-03-15 ENCOUNTER — Encounter (HOSPITAL_COMMUNITY): Payer: Self-pay | Admitting: *Deleted

## 2014-03-15 ENCOUNTER — Inpatient Hospital Stay (HOSPITAL_COMMUNITY)
Admission: AD | Admit: 2014-03-15 | Discharge: 2014-03-15 | Disposition: A | Discharge status: Home / Self Care | Payer: Medicaid Other | Source: Ambulatory Visit | Attending: Obstetrics | Admitting: Obstetrics

## 2014-03-15 ENCOUNTER — Inpatient Hospital Stay (HOSPITAL_COMMUNITY): Payer: Medicaid Other

## 2014-03-15 DIAGNOSIS — B3731 Acute candidiasis of vulva and vagina: Secondary | ICD-10-CM

## 2014-03-15 DIAGNOSIS — O4691 Antepartum hemorrhage, unspecified, first trimester: Secondary | ICD-10-CM

## 2014-03-15 DIAGNOSIS — Z87891 Personal history of nicotine dependence: Secondary | ICD-10-CM | POA: Insufficient documentation

## 2014-03-15 DIAGNOSIS — O209 Hemorrhage in early pregnancy, unspecified: Secondary | ICD-10-CM | POA: Insufficient documentation

## 2014-03-15 DIAGNOSIS — B373 Candidiasis of vulva and vagina: Secondary | ICD-10-CM

## 2014-03-15 DIAGNOSIS — O98811 Other maternal infectious and parasitic diseases complicating pregnancy, first trimester: Secondary | ICD-10-CM | POA: Diagnosis not present

## 2014-03-15 DIAGNOSIS — Z3A01 Less than 8 weeks gestation of pregnancy: Secondary | ICD-10-CM | POA: Diagnosis not present

## 2014-03-15 DIAGNOSIS — N898 Other specified noninflammatory disorders of vagina: Secondary | ICD-10-CM | POA: Diagnosis present

## 2014-03-15 LAB — WET PREP, GENITAL
CLUE CELLS WET PREP: NONE SEEN
TRICH WET PREP: NONE SEEN
Yeast Wet Prep HPF POC: NONE SEEN

## 2014-03-15 LAB — URINALYSIS, ROUTINE W REFLEX MICROSCOPIC
Bilirubin Urine: NEGATIVE
Glucose, UA: NEGATIVE mg/dL
Hgb urine dipstick: NEGATIVE
Ketones, ur: NEGATIVE mg/dL
LEUKOCYTES UA: NEGATIVE
NITRITE: NEGATIVE
PH: 8.5 — AB (ref 5.0–8.0)
Protein, ur: NEGATIVE mg/dL
SPECIFIC GRAVITY, URINE: 1.015 (ref 1.005–1.030)
UROBILINOGEN UA: 1 mg/dL (ref 0.0–1.0)

## 2014-03-15 LAB — CBC
HCT: 32.9 % — ABNORMAL LOW (ref 36.0–46.0)
Hemoglobin: 11.6 g/dL — ABNORMAL LOW (ref 12.0–15.0)
MCH: 30.5 pg (ref 26.0–34.0)
MCHC: 35.3 g/dL (ref 30.0–36.0)
MCV: 86.6 fL (ref 78.0–100.0)
PLATELETS: 230 10*3/uL (ref 150–400)
RBC: 3.8 MIL/uL — ABNORMAL LOW (ref 3.87–5.11)
RDW: 12.6 % (ref 11.5–15.5)
WBC: 12.8 10*3/uL — AB (ref 4.0–10.5)

## 2014-03-15 LAB — ABO/RH: ABO/RH(D): O POS

## 2014-03-15 LAB — HCG, QUANTITATIVE, PREGNANCY: hCG, Beta Chain, Quant, S: 8646 m[IU]/mL — ABNORMAL HIGH (ref <5)

## 2014-03-15 MED ORDER — TERCONAZOLE 0.4 % VA CREA: 1.0000 | TOPICAL_CREAM | Freq: Every day | VAGINAL | Status: DC

## 2014-03-15 NOTE — MAU Provider Note (Signed)
History     CSN: [id number]  Arrival date and time: 03/15/14 1610   First Provider Initiated Contact with Patient 03/15/14 1649      No chief complaint on file. CC: vaginal irritation HPI Tracie Garcia 29 y.o. [id number]  presents to MAU complaining of vaginal discharge.  It started several days ago and she began Monistat cream nightly the same day. The discharge has not changed.  She has discomfort which feels irritated but no itching or burning.  With wiping it feels swollen.  Today she is having spotting of pink-red blood.  She denies N/V, fever, weakness, dysuria.  No sex x 2 weeks.   OB History    Gravida Para Term Preterm AB TAB SAB Ectopic Multiple Living   0 3 3 0 0 0 2      Past Medical History  Diagnosis Date  . Abnormal Pap smear   . Gonorrhea   . Chlamydia     Past Surgical History  Procedure Laterality Date  . Colposcopy    . Dilation and curettage of uterus      tab x2  . Therapeutic abortion      Family History  Problem Relation Age of Onset  . Adopted: Yes    History  Substance Use Topics  . Smoking status: Former Smoker -- 0.25 packs/day    Types: Cigarettes  . Smokeless tobacco: Never Used  . Alcohol Use: No    Allergies: No Known Allergies  Prescriptions prior to admission  Medication Sig Dispense Refill Last Dose  . buPROPion (WELLBUTRIN SR) 150 MG 12 hr tablet Take 1 tablet po qam for 7 days, then increase to 1 po bid.  Take last dose no later than 5pm. 30 tablet 11   . clomiPHENE (CLOMID) 50 MG tablet Take 1 tablet po daily, starting day 5 of menstrual cycle. 5 tablet 1   . dicyclomine (BENTYL) 20 MG tablet Take 1 tablet (20 mg total) by mouth 2 (two) times daily as needed for spasms. (Patient not taking: Reported on 02/08/2014) 20 tablet 0 Not Taking  . levothyroxine (SYNTHROID, LEVOTHROID) 75 MCG tablet Take 75 mcg by mouth daily.    Taking  . Prenat-FeCbn-FeAspGl-FA-Omega (OB COMPLETE PETITE) 35-5-1-200 MG CAPS Take 1  capsule by mouth daily before breakfast. 90 capsule 3 Taking  . zolpidem (AMBIEN) 5 MG tablet Take 1 tablet (5 mg total) by mouth at bedtime as needed for sleep. 30 tablet 5     ROS Pertinent ROS in HPI  Physical Exam   Blood pressure 120/76, pulse 86, temperature 99.1 F (37.3 C), temperature source Oral, resp. rate 16, height  (1.499 m), weight 124 lb (56.246 kg), last menstrual period 02/11/2014.  Physical Exam  Constitutional: She is oriented to person, place, and time. She appears well-developed and well-nourished. No distress.  HENT:  Head: Normocephalic and atraumatic.  Eyes: EOM are normal.  Neck: Normal range of motion.  Cardiovascular: Normal rate, regular rhythm and normal heart sounds.   Respiratory: Effort normal and breath sounds normal. No respiratory distress.  GI: Soft. Bowel sounds are normal. She exhibits no distension. There is no tenderness. There is no rebound and no guarding.  Genitourinary: Vagina normal.  Copious amounts of clumpy greenish discharge in vagina.  Walls appear erythematous.  No evidence of blood. Cervix is closed.  No CMT.  No adnexal mass or tenderness.  Musculoskeletal: Normal range of motion.  Neurological: She is alert and oriented to person, place,  and time.  Skin: Skin is warm and dry.  Psychiatric: She has a normal mood and affect.    MAU Course  Procedures  Results for orders placed or performed during the hospital encounter of 03/15/14 (from the past 24 hour(s))  Urinalysis, Routine w reflex microscopic     Status: Abnormal   Collection Time: 03/15/14  4:20 PM  Result Value Ref Range   Color, Urine YELLOW YELLOW   APPearance HAZY (A) CLEAR   Specific Gravity, Urine 1.015 1.005 - 1.030   pH 8.5 (H) 5.0 - 8.0   Glucose, UA NEGATIVE NEGATIVE mg/dL   Hgb urine dipstick NEGATIVE NEGATIVE   Bilirubin Urine NEGATIVE NEGATIVE   Ketones, ur NEGATIVE NEGATIVE mg/dL   Protein, ur NEGATIVE NEGATIVE mg/dL   Urobilinogen, UA 1.0 0.0 -  1.0 mg/dL   Nitrite NEGATIVE NEGATIVE   Leukocytes, UA NEGATIVE NEGATIVE  Wet prep, genital     Status: Abnormal   Collection Time: 03/15/14  5:00 PM  Result Value Ref Range   Yeast Wet Prep HPF POC NONE SEEN NONE SEEN   Trich, Wet Prep NONE SEEN NONE SEEN   Clue Cells Wet Prep HPF POC NONE SEEN NONE SEEN   WBC, Wet Prep HPF POC MANY (A) NONE SEEN  CBC     Status: Abnormal   Collection Time: 03/15/14  5:08 PM  Result Value Ref Range   WBC 12.8 (H) 4.0 - 10.5 K/uL   RBC 3.80 (L) 3.87 - 5.11 MIL/uL   Hemoglobin 11.6 (L) 12.0 - 15.0 g/dL   HCT 16.1 (L) 09.6 - 04.5 %   MCV 86.6 78.0 - 100.0 fL   MCH 30.5 26.0 - 34.0 pg   MCHC 35.3 30.0 - 36.0 g/dL   RDW 40.9 81.1 - 91.4 %   Platelets 230 150 - 400 K/uL  US Ob Comp Less 14 Wks  03/15/2014   CLINICAL DATA:  Vaginal bleeding in first trimester of pregnancy, EGA [redacted] weeks 4 days by LMP of 02/11/2014 ; no quantitative beta HCG for correlation at this time  EXAM: OBSTETRIC <14 WK [place] AND TRANSVAGINAL OB US  TECHNIQUE: Both transabdominal and transvaginal ultrasound examinations were performed for complete evaluation of the gestation as well as the maternal uterus, adnexal regions, and pelvic cul-de-sac. Transvaginal technique was performed to assess early pregnancy.  COMPARISON:  None for this gestational  FINDINGS: Intrauterine gestational sac: Visualized/normal in shape.  Yolk sac:  Present  Embryo:  Not identified  Cardiac Activity: N/A  Heart Rate: N/A  bpm  MSD: 6.0  mm   5 w   1  d  Maternal uterus/adnexae:  No subchorionic hemorrhage.  RIGHT ovary normal size and morphology 2.6 x 1.7 x 2.2 cm.  LEFT ovary normal size and morphology 4.1 x 2.2 x 2.3 cm.  No adnexal masses or free pelvic fluid.  IMPRESSION: Gestational sac with yolk sac in uterus though no fetal pole is identified.  Remainder of exam unremarkable.   Electronically Signed   By: Ulyses Southward M.D.   On: 03/15/2014 17:54   US Ob Transvaginal  03/15/2014   CLINICAL DATA:  Vaginal bleeding  in first trimester of pregnancy, EGA [redacted] weeks 4 days by LMP of 02/11/2014 ; no quantitative beta HCG for correlation at this time  EXAM: OBSTETRIC <14 WK [place] AND TRANSVAGINAL OB US  TECHNIQUE: Both transabdominal and transvaginal ultrasound examinations were performed for complete evaluation of the gestation as well as the maternal uterus, adnexal regions, and  pelvic cul-de-sac. Transvaginal technique was performed to assess early pregnancy.  COMPARISON:  None for this gestational  FINDINGS: Intrauterine gestational sac: Visualized/normal in shape.  Yolk sac:  Present  Embryo:  Not identified  Cardiac Activity: N/A  Heart Rate: N/A  bpm  MSD: 6.0  mm   5 w   1  d  Maternal uterus/adnexae:  No subchorionic hemorrhage.  RIGHT ovary normal size and morphology 2.6 x 1.7 x 2.2 cm.  LEFT ovary normal size and morphology 4.1 x 2.2 x 2.3 cm.  No adnexal masses or free pelvic fluid.  IMPRESSION: Gestational sac with yolk sac in uterus though no fetal pole is identified.  Remainder of exam unremarkable.   Electronically Signed   By: Ulyses SouthwardMark  Boles M.D.   On: 03/15/2014 17:54    MDM No evidence of bleeding.  Wet prep consistent with yeast.  No concerns identified with lab or u/s.  Ectopic ruled out with evidence of yolk sac.    Assessment and Plan  A:  1. Vulvovaginal candidiasis   2. Vaginal bleeding in pregnancy, first trimester    P: Discharge to home Terazole Follow up with Dr. Clearance [facility] Patient may return to MAU as needed or if her condition were to change or worsen   Bertram Denvereague Clark, Karen E 03/15/2014, 4:50 PM

## 2014-03-15 NOTE — Discharge Instructions (Signed)

## 2014-03-16 LAB — HIV ANTIBODY (ROUTINE TESTING W REFLEX): HIV Screen 4th Generation wRfx: NONREACTIVE

## 2014-03-18 ENCOUNTER — Telehealth: Payer: Self-pay | Admitting: *Deleted

## 2014-03-18 LAB — GC/CHLAMYDIA PROBE AMP (~~LOC~~) NOT AT ARMC
Chlamydia: NEGATIVE
Neisseria Gonorrhea: NEGATIVE

## 2014-03-18 NOTE — Telephone Encounter (Signed)
Patient state she needs something for nausea and vomiting. 10:30 LM on VM to CB

## 2014-03-18 NOTE — Telephone Encounter (Signed)
10:52 Patient returned call- LM. 10:55 Spoke with patient- Has started n/v within the past 2 days- she is not sure if pregnancy related or she has a "bug'- she is a Lawyer[employer]NA.  Advised grazing diet- with bland foods, OTC medication for nausea, watch fluid intake. Patient  to try these things and will call back for increasing symptoms.

## 2014-03-20 ENCOUNTER — Encounter (HOSPITAL_COMMUNITY): Payer: Self-pay | Admitting: *Deleted

## 2014-03-20 ENCOUNTER — Inpatient Hospital Stay (HOSPITAL_COMMUNITY)
Admission: AD | Admit: 2014-03-20 | Discharge: 2014-03-20 | Disposition: A | Discharge status: Home / Self Care | Payer: Medicaid Other | Source: Ambulatory Visit | Attending: Obstetrics | Admitting: Obstetrics

## 2014-03-20 DIAGNOSIS — R1030 Lower abdominal pain, unspecified: Secondary | ICD-10-CM | POA: Insufficient documentation

## 2014-03-20 DIAGNOSIS — Z87891 Personal history of nicotine dependence: Secondary | ICD-10-CM | POA: Diagnosis not present

## 2014-03-20 DIAGNOSIS — O219 Vomiting of pregnancy, unspecified: Secondary | ICD-10-CM

## 2014-03-20 DIAGNOSIS — Z3A01 Less than 8 weeks gestation of pregnancy: Secondary | ICD-10-CM | POA: Diagnosis not present

## 2014-03-20 DIAGNOSIS — O210 Mild hyperemesis gravidarum: Secondary | ICD-10-CM | POA: Diagnosis not present

## 2014-03-20 LAB — URINALYSIS, ROUTINE W REFLEX MICROSCOPIC
Bilirubin Urine: NEGATIVE
Glucose, UA: NEGATIVE mg/dL
Hgb urine dipstick: NEGATIVE
Ketones, ur: NEGATIVE mg/dL
Nitrite: NEGATIVE
PROTEIN: NEGATIVE mg/dL
Specific Gravity, Urine: 1.015 (ref 1.005–1.030)
Urobilinogen, UA: 0.2 mg/dL (ref 0.0–1.0)
pH: 7 (ref 5.0–8.0)

## 2014-03-20 LAB — COMPREHENSIVE METABOLIC PANEL
ALT: 12 U/L (ref 0–35)
ANION GAP: 8 (ref 5–15)
AST: 18 U/L (ref 0–37)
Albumin: 4.2 g/dL (ref 3.5–5.2)
Alkaline Phosphatase: 47 U/L (ref 39–117)
BILIRUBIN TOTAL: 0.5 mg/dL (ref 0.3–1.2)
BUN: 7 mg/dL (ref 6–23)
CALCIUM: 9.4 mg/dL (ref 8.4–10.5)
CO2: 26 mmol/L (ref 19–32)
CREATININE: 0.73 mg/dL (ref 0.50–1.10)
Chloride: 104 mmol/L (ref 96–112)
GFR calc Af Amer: >90 mL/min (ref >90)
Glucose, Bld: 94 mg/dL (ref 70–99)
Potassium: 3.6 mmol/L (ref 3.5–5.1)
Sodium: 138 mmol/L (ref 135–145)
Total Protein: 7.4 g/dL (ref 6.0–8.3)

## 2014-03-20 LAB — CBC WITH DIFFERENTIAL/PLATELET
Basophils Absolute: 0 10*3/uL (ref 0.0–0.1)
Basophils Relative: 0 % (ref 0–1)
Eosinophils Absolute: 0.1 10*3/uL (ref 0.0–0.7)
Eosinophils Relative: 1 % (ref 0–5)
HCT: 33.5 % — ABNORMAL LOW (ref 36.0–46.0)
Hemoglobin: 11.6 g/dL — ABNORMAL LOW (ref 12.0–15.0)
Lymphocytes Relative: 34 % (ref 12–46)
Lymphs Abs: 3.4 10*3/uL (ref 0.7–4.0)
MCH: 30.3 pg (ref 26.0–34.0)
MCHC: 34.6 g/dL (ref 30.0–36.0)
MCV: 87.5 fL (ref 78.0–100.0)
MONOS PCT: 7 % (ref 3–12)
Monocytes Absolute: 0.7 10*3/uL (ref 0.1–1.0)
NEUTROS ABS: 5.7 10*3/uL (ref 1.7–7.7)
NEUTROS PCT: 58 % (ref 43–77)
Platelets: 244 10*3/uL (ref 150–400)
RBC: 3.83 MIL/uL — ABNORMAL LOW (ref 3.87–5.11)
RDW: 12.6 % (ref 11.5–15.5)
WBC: 9.8 10*3/uL (ref 4.0–10.5)

## 2014-03-20 LAB — URINE MICROSCOPIC-ADD ON

## 2014-03-20 MED ORDER — METOCLOPRAMIDE HCL 10 MG PO TABS: 10.0000 mg | ORAL_TABLET | Freq: Four times a day (QID) | ORAL | Status: DC

## 2014-03-20 MED ORDER — PROMETHAZINE HCL 25 MG/ML IJ SOLN
25.0000 mg | Freq: Once | INTRAMUSCULAR | Status: AC
  Administered 2014-03-20: 25 mg via INTRAVENOUS
  Filled 2014-03-20: qty 1

## 2014-03-20 MED ORDER — LACTATED RINGERS IV BOLUS (SEPSIS): 1000.0000 mL | Freq: Once | INTRAVENOUS | Status: DC

## 2014-03-20 MED ORDER — PROMETHAZINE HCL 25 MG PO TABS: 25.0000 mg | ORAL_TABLET | Freq: Four times a day (QID) | ORAL | Status: DC | PRN

## 2014-03-20 MED ORDER — METOCLOPRAMIDE HCL 5 MG/ML IJ SOLN
10.0000 mg | Freq: Once | INTRAMUSCULAR | Status: AC
  Administered 2014-03-20: 10 mg via INTRAVENOUS
  Filled 2014-03-20: qty 2

## 2014-03-20 NOTE — MAU Provider Note (Signed)
History     CSN: [id number]  Arrival date and time: 03/20/14 1721   First Provider Initiated Contact with Patient 03/20/14 1812      Chief Complaint  Patient presents with  . Emesis During Pregnancy   HPI  Ms. Tracie Garcia is a 29 y.o. [id number] at [redacted]w[redacted]d who presents to MAU today with complaint of N/V since Sunday. She has tried Dramamine without relief. She also states that she tried changing her diet and eating small meals and then just tried water and is still unable to tolerate PO intake. She has a 5 lb weight loss since her last MAU visit last week. She states some lower abdominal pain worse with vomiting. She denies vaginal bleeding, diarrhea, fever or UTI symptoms. She has had some dizziness.   OB History    Gravida Para Term Preterm AB TAB SAB Ectopic Multiple Living   0 3 3 0 0 0 2      Past Medical History  Diagnosis Date  . Abnormal Pap smear   . Gonorrhea   . Chlamydia     Past Surgical History  Procedure Laterality Date  . Colposcopy    . Dilation and curettage of uterus      tab x2  . Therapeutic abortion      Family History  Problem Relation Age of Onset  . Adopted: Yes    History  Substance Use Topics  . Smoking status: Former Smoker -- 0.25 packs/day    Types: Cigarettes  . Smokeless tobacco: Never Used  . Alcohol Use: No    Allergies: No Known Allergies  Prescriptions prior to admission  Medication Sig Dispense Refill Last Dose  . buPROPion (WELLBUTRIN SR) 150 MG 12 hr tablet Take 1 tablet po qam for 7 days, then increase to 1 po bid.  Take last dose no later than 5pm. (Patient taking differently: Take 150 mg by mouth 2 (two) times daily. Take 1 tablet po qam for 7 days, then increase to 1 po bid.  Take last dose no later than 5pm.) 30 tablet 11 Past Week at Unknown time  . dimenhyDRINATE (DRAMAMINE) 50 MG tablet Take 50 mg by mouth every 6 (six) hours as needed for nausea.   03/20/2014 at Unknown time  . levothyroxine (SYNTHROID,  LEVOTHROID) 75 MCG tablet Take 75 mcg by mouth daily.    03/20/2014 at Unknown time  . Prenat-FeCbn-FeAspGl-FA-Omega (OB COMPLETE PETITE) 35-5-1-200 MG CAPS Take 1 capsule by mouth daily before breakfast. 90 capsule 3 03/20/2014 at Unknown time  . terconazole (TERAZOL 7) 0.4 % vaginal cream Place 1 applicator vaginally at bedtime. Apply to external surface as well. 45 g 0 03/19/2014 at Unknown time    Review of Systems  Constitutional: Positive for malaise/fatigue. Negative for fever.  Gastrointestinal: Positive for nausea, vomiting and abdominal pain. Negative for diarrhea and constipation.  Genitourinary: Negative for dysuria, urgency and frequency.       Neg - vaginal bleeding, discharge  Neurological: Positive for dizziness and weakness. Negative for loss of consciousness.   Physical Exam   Blood pressure 75/59, pulse 72, temperature 98.4 F (36.9 C), height  (1.499 m), weight 119 lb (53.978 kg), last menstrual period 02/11/2014.  Physical Exam  Constitutional: She is oriented to person, place, and time. She appears well-developed and well-nourished. No distress.  HENT:  Head: Normocephalic.  Cardiovascular: Normal rate.   Respiratory: Effort normal.  GI: Soft. She exhibits no distension and no mass. There is  tenderness (mild tenderness to palpation of the LLQ ). There is no rebound and no guarding.  Neurological: She is alert and oriented to person, place, and time.  Skin: Skin is warm and dry. No erythema.  Psychiatric: She has a normal mood and affect.   Results for orders placed or performed during the hospital encounter of 03/20/14 (from the past 24 hour(s))  Urinalysis, Routine w reflex microscopic     Status: Abnormal   Collection Time: 03/20/14  5:35 PM  Result Value Ref Range   Color, Urine YELLOW YELLOW   APPearance CLEAR CLEAR   Specific Gravity, Urine 1.015 1.005 - 1.030   pH 7.0 5.0 - 8.0   Glucose, UA NEGATIVE NEGATIVE mg/dL   Hgb urine dipstick NEGATIVE  NEGATIVE   Bilirubin Urine NEGATIVE NEGATIVE   Ketones, ur NEGATIVE NEGATIVE mg/dL   Protein, ur NEGATIVE NEGATIVE mg/dL   Urobilinogen, UA 0.2 0.0 - 1.0 mg/dL   Nitrite NEGATIVE NEGATIVE   Leukocytes, UA TRACE (A) NEGATIVE  Urine microscopic-add on     Status: Abnormal   Collection Time: 03/20/14  5:35 PM  Result Value Ref Range   Squamous Epithelial / LPF FEW (A) RARE   WBC, UA 0-2 <3 WBC/hpf  CBC with Differential/Platelet     Status: Abnormal   Collection Time: 03/20/14  6:42 PM  Result Value Ref Range   WBC 9.8 4.0 - 10.5 K/uL   RBC 3.83 (L) 3.87 - 5.11 MIL/uL   Hemoglobin 11.6 (L) 12.0 - 15.0 g/dL   HCT 40.933.5 (L) 81.136.0 - 91.446.0 %   MCV 87.5 78.0 - 100.0 fL   MCH 30.3 26.0 - 34.0 pg   MCHC 34.6 30.0 - 36.0 g/dL   RDW 78.212.6 95.611.5 - 21.315.5 %   Platelets 244 150 - 400 K/uL   Neutrophils Relative % 58 43 - 77 %   Neutro Abs 5.7 1.7 - 7.7 K/uL   Lymphocytes Relative 34 12 - 46 %   Lymphs Abs 3.4 0.7 - 4.0 K/uL   Monocytes Relative 7 3 - 12 %   Monocytes Absolute 0.7 0.1 - 1.0 K/uL   Eosinophils Relative 1 0 - 5 %   Eosinophils Absolute 0.1 0.0 - 0.7 K/uL   Basophils Relative 0 0 - 1 %   Basophils Absolute 0.0 0.0 - 0.1 K/uL  Comprehensive metabolic panel     Status: None   Collection Time: 03/20/14  6:42 PM  Result Value Ref Range   Sodium 138 135 - 145 mmol/L   Potassium 3.6 3.5 - 5.1 mmol/L   Chloride 104 96 - 112 mmol/L   CO2 26 19 - 32 mmol/L   Glucose, Bld 94 70 - 99 mg/dL   BUN 7 6 - 23 mg/dL   Creatinine, Ser 0.860.73 0.50 - 1.10 mg/dL   Calcium 9.4 8.4 - 57.810.5 mg/dL   Total Protein 7.4 6.0 - 8.3 g/dL   Albumin 4.2 3.5 - 5.2 g/dL   AST 18 0 - 37 U/L   ALT 12 0 - 35 U/L   Alkaline Phosphatase 47 39 - 117 U/L   Total Bilirubin 0.5 0.3 - 1.2 mg/dL   GFR calc non Af Amer >90 >90 mL/min   GFR calc Af Amer >90 >90 mL/min   Anion gap 8 5 - 15    MAU Course  Procedures None  MDM UA today shows no signs of dehydration, but patient is actively vomiting in MAU 1 liter LR  and 25 mg Phenergan  given CBC and CMP without gross abnormalities Patient attempted PO intake after Phenergan and ~ 500 cc of fluid and resumed N/V 10 mg Reglan IV given - patient states continued mild nausea without additional emesis Discussed risks vs benefits of Zofran and patient declines at this time Assessment and Plan  A: [facility] at [redacted]w[redacted]d Nausea and vomiting in pregnancy prior to [redacted] weeks gestation  P: Discharge home Rx for Phenergan and Reglan given Discussed diet for N/V in pregnancy Encouraged increased PO hydration Patient advised to follow-up with Dr. Clearance Coots as scheduled to start prenatal care Patient may return to MAU as needed or if her condition were to change or worsen   Marny Lowenstein, PA-C  03/20/2014, 8:13 PM

## 2014-03-20 NOTE — Discharge Instructions (Signed)
Morning Sickness °Morning sickness is when you feel sick to your stomach (nauseous) during pregnancy. You may feel sick to your stomach and throw up (vomit). You may feel sick in the morning, but you can feel this way any time of day. Some women feel very sick to their stomach and cannot stop throwing up (hyperemesis gravidarum). °HOME CARE °· Only take medicines as told by your doctor. °· Take multivitamins as told by your doctor. Taking multivitamins before getting pregnant can stop or lessen the harshness of morning sickness. °· Eat dry toast or unsalted crackers before getting out of bed. °· Eat 5 to 6 small meals a day. °· Eat dry and bland foods like rice and baked potatoes. °· Do not drink liquids with meals. Drink between meals. °· Do not eat greasy, fatty, or spicy foods. °· Have someone cook for you if the smell of food causes you to feel sick or throw up. °· If you feel sick to your stomach after taking prenatal vitamins, take them at night or with a snack. °· Eat protein when you need a snack (nuts, yogurt, cheese). °· Eat unsweetened gelatins for dessert. °· Wear a bracelet used for sea sickness (acupressure wristband). °· Go to a doctor that puts thin needles into certain body points (acupuncture) to improve how you feel. °· Do not smoke. °· Use a humidifier to keep the air in your house free of odors. °· Get lots of fresh air. °GET HELP IF: °· You need medicine to feel better. °· You feel dizzy or lightheaded. °· You are losing weight. °GET HELP RIGHT AWAY IF:  °· You feel very sick to your stomach and cannot stop throwing up. °· You pass out (faint). °MAKE SURE YOU: °· Understand these instructions. °· Will watch your condition. °· Will get help right away if you are not doing well or get worse. °Document Released: 01/29/2004 Document Revised: 12/26/2012 Document Reviewed: 06/07/2012 °Exit[employer]® Patient Information ©2015 [employer]. This information is not intended to replace advice given to you by  your health care provider. Make sure you discuss any questions you have with your health care provider. ° °Eating Plan for Hyperemesis Gravidarum °Severe cases of hyperemesis gravidarum can lead to dehydration and malnutrition. The hyperemesis eating plan is one way to lessen the symptoms of nausea and vomiting. It is often used with prescribed medicines to control your symptoms.  °WHAT CAN I DO TO RELIEVE MY SYMPTOMS? °Listen to your body. Everyone is different and has different preferences. Find what works best for you. Some of the following things may help: °· Eat and drink slowly. °· Eat 5-6 small meals daily instead of 3 large meals.   °· Eat crackers before you get out of bed in the morning.   °· Starchy foods are usually well tolerated (such as cereal, toast, bread, potatoes, pasta, rice, and pretzels).   °· Ginger may help with nausea. Add ¼ tsp ground ginger to hot tea or choose ginger tea.   °· Try drinking 100% fruit juice or an electrolyte drink. °· Continue to take your prenatal vitamins as directed by your health care provider. If you are having trouble taking your prenatal vitamins, talk with your health care provider about different options. °· Include at least 1 serving of protein with your meals and snacks (such as meats or poultry, beans, nuts, eggs, or yogurt). Try eating a protein-rich snack before bed (such as cheese and crackers or a half turkey or peanut butter sandwich). °WHAT THINGS SHOULD I   AVOID TO REDUCE MY SYMPTOMS? °The following things may help reduce your symptoms: °· Avoid foods with strong smells. Try eating meals in well-ventilated areas that are free of odors. °· Avoid drinking water or other beverages with meals. Try not to drink anything less than 30 minutes before and after meals. °· Avoid drinking more than 1 cup of fluid at a time. °· Avoid fried or high-fat foods, such as butter and cream sauces. °· Avoid spicy foods. °· Avoid skipping meals the best you can. Nausea can be  more intense on an empty stomach. If you cannot tolerate food at that time, do not force it. Try sucking on ice chips or other frozen items and make up the calories later. °· Avoid lying down within 2 hours after eating. °Document Released: 10/18/2006 Document Revised: 12/26/2012 Document Reviewed: 10/25/2012 °ExitCare® Patient Information ©2015 [employer]. This information is not intended to replace advice given to you by your health care provider. Make sure you discuss any questions you have with your health care provider. ° °

## 2014-03-20 NOTE — MAU Note (Signed)
Pt presents to MAU with complaints of nausea and vomiting for weeks with the pregnancy. Reports she has not been able to keep anything down today. Denies any abdominal cramping or vaginal bleeding.

## 2014-03-21 ENCOUNTER — Encounter (HOSPITAL_COMMUNITY): Payer: Self-pay | Admitting: *Deleted

## 2014-03-21 ENCOUNTER — Inpatient Hospital Stay (HOSPITAL_COMMUNITY)
Admission: AD | Admit: 2014-03-21 | Discharge: 2014-03-21 | Disposition: A | Discharge status: Home / Self Care | Payer: Medicaid Other | Source: Ambulatory Visit | Attending: Family Medicine | Admitting: Family Medicine

## 2014-03-21 DIAGNOSIS — O219 Vomiting of pregnancy, unspecified: Secondary | ICD-10-CM | POA: Diagnosis not present

## 2014-03-21 DIAGNOSIS — Z3A01 Less than 8 weeks gestation of pregnancy: Secondary | ICD-10-CM | POA: Insufficient documentation

## 2014-03-21 DIAGNOSIS — O210 Mild hyperemesis gravidarum: Secondary | ICD-10-CM | POA: Insufficient documentation

## 2014-03-21 DIAGNOSIS — Z87891 Personal history of nicotine dependence: Secondary | ICD-10-CM | POA: Diagnosis not present

## 2014-03-21 LAB — URINALYSIS, ROUTINE W REFLEX MICROSCOPIC
Bilirubin Urine: NEGATIVE
GLUCOSE, UA: NEGATIVE mg/dL
Hgb urine dipstick: NEGATIVE
Ketones, ur: 40 mg/dL — AB
LEUKOCYTES UA: NEGATIVE
NITRITE: NEGATIVE
PROTEIN: NEGATIVE mg/dL
SPECIFIC GRAVITY, URINE: 1.01 (ref 1.005–1.030)
Urobilinogen, UA: 0.2 mg/dL (ref 0.0–1.0)
pH: 6.5 (ref 5.0–8.0)

## 2014-03-21 MED ORDER — ONDANSETRON 8 MG PO TBDP
8.0000 mg | ORAL_TABLET | Freq: Once | ORAL | Status: AC
  Administered 2014-03-21: 8 mg via ORAL
  Filled 2014-03-21: qty 1

## 2014-03-21 MED ORDER — DEXTROSE IN LACTATED RINGERS 5 % IV SOLN
INTRAVENOUS | Status: DC
  Administered 2014-03-21: 19:00:00 via INTRAVENOUS

## 2014-03-21 MED ORDER — ONDANSETRON HCL 4 MG PO TABS: 4.0000 mg | ORAL_TABLET | Freq: Four times a day (QID) | ORAL | Status: DC

## 2014-03-21 NOTE — Discharge Instructions (Signed)
Morning Sickness °Morning sickness is when you feel sick to your stomach (nauseous) during pregnancy. You may feel sick to your stomach and throw up (vomit). You may feel sick in the morning, but you can feel this way any time of day. Some women feel very sick to their stomach and cannot stop throwing up (hyperemesis gravidarum). °HOME CARE °· Only take medicines as told by your doctor. °· Take multivitamins as told by your doctor. Taking multivitamins before getting pregnant can stop or lessen the harshness of morning sickness. °· Eat dry toast or unsalted crackers before getting out of bed. °· Eat 5 to 6 small meals a day. °· Eat dry and bland foods like rice and baked potatoes. °· Do not drink liquids with meals. Drink between meals. °· Do not eat greasy, fatty, or spicy foods. °· Have someone cook for you if the smell of food causes you to feel sick or throw up. °· If you feel sick to your stomach after taking prenatal vitamins, take them at night or with a snack. °· Eat protein when you need a snack (nuts, yogurt, cheese). °· Eat unsweetened gelatins for dessert. °· Wear a bracelet used for sea sickness (acupressure wristband). °· Go to a doctor that puts thin needles into certain body points (acupuncture) to improve how you feel. °· Do not smoke. °· Use a humidifier to keep the air in your house free of odors. °· Get lots of fresh air. °GET HELP IF: °· You need medicine to feel better. °· You feel dizzy or lightheaded. °· You are losing weight. °GET HELP RIGHT AWAY IF:  °· You feel very sick to your stomach and cannot stop throwing up. °· You pass out (faint). °MAKE SURE YOU: °· Understand these instructions. °· Will watch your condition. °· Will get help right away if you are not doing well or get worse. °Document Released: 01/29/2004 Document Revised: 12/26/2012 Document Reviewed: 06/07/2012 °Exit[employer]® Patient Information ©2015 [employer]. This information is not intended to replace advice given to you by  your health care provider. Make sure you discuss any questions you have with your health care provider. ° °Eating Plan for Hyperemesis Gravidarum °Severe cases of hyperemesis gravidarum can lead to dehydration and malnutrition. The hyperemesis eating plan is one way to lessen the symptoms of nausea and vomiting. It is often used with prescribed medicines to control your symptoms.  °WHAT CAN I DO TO RELIEVE MY SYMPTOMS? °Listen to your body. Everyone is different and has different preferences. Find what works best for you. Some of the following things may help: °· Eat and drink slowly. °· Eat 5-6 small meals daily instead of 3 large meals.   °· Eat crackers before you get out of bed in the morning.   °· Starchy foods are usually well tolerated (such as cereal, toast, bread, potatoes, pasta, rice, and pretzels).   °· Ginger may help with nausea. Add ¼ tsp ground ginger to hot tea or choose ginger tea.   °· Try drinking 100% fruit juice or an electrolyte drink. °· Continue to take your prenatal vitamins as directed by your health care provider. If you are having trouble taking your prenatal vitamins, talk with your health care provider about different options. °· Include at least 1 serving of protein with your meals and snacks (such as meats or poultry, beans, nuts, eggs, or yogurt). Try eating a protein-rich snack before bed (such as cheese and crackers or a half turkey or peanut butter sandwich). °WHAT THINGS SHOULD I   AVOID TO REDUCE MY SYMPTOMS? °The following things may help reduce your symptoms: °· Avoid foods with strong smells. Try eating meals in well-ventilated areas that are free of odors. °· Avoid drinking water or other beverages with meals. Try not to drink anything less than 30 minutes before and after meals. °· Avoid drinking more than 1 cup of fluid at a time. °· Avoid fried or high-fat foods, such as butter and cream sauces. °· Avoid spicy foods. °· Avoid skipping meals the best you can. Nausea can be  more intense on an empty stomach. If you cannot tolerate food at that time, do not force it. Try sucking on ice chips or other frozen items and make up the calories later. °· Avoid lying down within 2 hours after eating. °Document Released: 10/18/2006 Document Revised: 12/26/2012 Document Reviewed: 10/25/2012 °ExitCare® Patient Information ©2015 [employer]. This information is not intended to replace advice given to you by your health care provider. Make sure you discuss any questions you have with your health care provider. ° °

## 2014-03-21 NOTE — MAU Note (Signed)
Sick since she left last night.  Was given rx, can't keep it down.  Has noted blood in emesis

## 2014-03-21 NOTE — MAU Provider Note (Signed)
History     CSN: [id number]  Arrival date and time: 03/21/14 1825   None     Chief Complaint  Patient presents with  . Emesis During Pregnancy   HPI This is a 29 y.o. female at 6189w3d who presents with persistent nausea and vomiting.  Was seen last night for the same thing and was given Phenergan and Reglan. Refused Zofran.   RN Note:  Expand All Collapse All   Sick since she left last night. Was given rx, can't keep it down. Has noted blood in emesis          OB History    Gravida Para Term Preterm AB TAB SAB Ectopic Multiple Living   6 2 2  0 3 3 0 0 0 2      Past Medical History  Diagnosis Date  . Abnormal Pap smear   . Gonorrhea   . Chlamydia     Past Surgical History  Procedure Laterality Date  . Colposcopy    . Dilation and curettage of uterus      tab x2  . Therapeutic abortion      Family History  Problem Relation Age of Onset  . Adopted: Yes    History  Substance Use Topics  . Smoking status: Former Smoker -- 0.25 packs/day    Types: Cigarettes  . Smokeless tobacco: Never Used  . Alcohol Use: No    Allergies: No Known Allergies  Prescriptions prior to admission  Medication Sig Dispense Refill Last Dose  . buPROPion (WELLBUTRIN SR) 150 MG 12 hr tablet Take 1 tablet po qam for 7 days, then increase to 1 po bid.  Take last dose no later than 5pm. (Patient taking differently: Take 150 mg by mouth 2 (two) times daily. Take 1 tablet po qam for 7 days, then increase to 1 po bid.  Take last dose no later than 5pm.) 30 tablet 11 Past Week at Unknown time  . dimenhyDRINATE (DRAMAMINE) 50 MG tablet Take 50 mg by mouth every 6 (six) hours as needed for nausea.   Past Week at Unknown time  . levothyroxine (SYNTHROID, LEVOTHROID) 75 MCG tablet Take 75 mcg by mouth daily.    Past Week at Unknown time  . metoCLOPramide (REGLAN) 10 MG tablet Take 1 tablet (10 mg total) by mouth every 6 (six) hours. 30 tablet 0 03/21/2014 at Unknown time  .  Prenat-FeCbn-FeAspGl-FA-Omega (OB COMPLETE PETITE) 35-5-1-200 MG CAPS Take 1 capsule by mouth daily before breakfast. 90 capsule 3 Past Week at Unknown time  . promethazine (PHENERGAN) 25 MG tablet Take 1 tablet (25 mg total) by mouth every 6 (six) hours as needed for nausea or vomiting. 30 tablet 0 03/21/2014 at Unknown time  . terconazole (TERAZOL 7) 0.4 % vaginal cream Place 1 applicator vaginally at bedtime. Apply to external surface as well. (Patient not taking: Reported on 03/21/2014) 45 g 0 Completed Course at Unknown time    Review of Systems  Constitutional: Positive for weight loss and malaise/fatigue. Negative for fever and chills.  Gastrointestinal: Positive for nausea and vomiting. Negative for abdominal pain, diarrhea and constipation.  Neurological: Positive for weakness. Negative for dizziness and headaches.   Physical Exam   Blood pressure 116/59, pulse 82, temperature 98.9 F (37.2 C), temperature source Oral, resp. rate 18, weight 121 lb (54.885 kg), last menstrual period 02/11/2014.  Physical Exam  Constitutional: She is oriented to person, place, and time. She appears well-developed. No distress.  HENT:  Head: Normocephalic.  Cardiovascular:  Normal rate.   Respiratory: Effort normal.  GI: Soft. She exhibits no distension. There is no tenderness. There is no rebound and no guarding.  Musculoskeletal: Normal range of motion.  Neurological: She is alert and oriented to person, place, and time.  Skin: Skin is warm and dry.  Psychiatric: She has a normal mood and affect.    MAU Course  Procedures  MDM  Will give IV fluids and try a dose of Zofran ODT.  Results for orders placed or performed during the hospital encounter of 03/21/14 (from the past 24 hour(s))  Urinalysis, Routine w reflex microscopic     Status: Abnormal   Collection Time: 03/21/14  6:35 PM  Result Value Ref Range   Color, Urine YELLOW YELLOW   APPearance CLEAR CLEAR   Specific Gravity, Urine  1.010 1.005 - 1.030   pH 6.5 5.0 - 8.0   Glucose, UA NEGATIVE NEGATIVE mg/dL   Hgb urine dipstick NEGATIVE NEGATIVE   Bilirubin Urine NEGATIVE NEGATIVE   Ketones, ur 40 (A) NEGATIVE mg/dL   Protein, ur NEGATIVE NEGATIVE mg/dL   Urobilinogen, UA 0.2 0.0 - 1.0 mg/dL   Nitrite NEGATIVE NEGATIVE   Leukocytes, UA NEGATIVE NEGATIVE    Assessment and Plan  A:  SIUP at [redacted]w[redacted]d       Severe nausea and vomiting of early pregnancy  P:  Report given to oncoming provider  [facility] 03/21/2014, 6:50 PM   MDM 2000 - Care assumed from Wynelle Bourgeois, CNM Patient reports significant improvement in symptoms with Zofran and IV fluids  A: SIUP at [redacted]w[redacted]d Nausea and vomiting in pregnancy prior to [redacted] weeks gestation  P: Discharge home Rx for Zofran given to patient Diet for N/V in pregnancy discussed Patient advised to follow-up with Dr. Clearance Coots as scheduled to start prenatal care Patient may return to MAU as needed or if her condition were to change or worsen   Marny Lowenstein, PA-C  03/21/2014 8:38 PM

## 2014-03-25 ENCOUNTER — Telehealth: Payer: Self-pay | Admitting: *Deleted

## 2014-03-25 NOTE — Telephone Encounter (Signed)
Pt called to office this morning stating that she is needing a Rx.  Return call to pt.  LM on VM to contact office.

## 2014-03-26 ENCOUNTER — Telehealth: Payer: Self-pay | Admitting: *Deleted

## 2014-03-26 ENCOUNTER — Inpatient Hospital Stay (HOSPITAL_COMMUNITY)
Admission: AD | Admit: 2014-03-26 | Discharge: 2014-03-26 | Disposition: A | Discharge status: Home / Self Care | Payer: Medicaid Other | Source: Ambulatory Visit | Attending: Obstetrics | Admitting: Obstetrics

## 2014-03-26 ENCOUNTER — Inpatient Hospital Stay (HOSPITAL_COMMUNITY): Payer: Medicaid Other

## 2014-03-26 ENCOUNTER — Ambulatory Visit (INDEPENDENT_AMBULATORY_CARE_PROVIDER_SITE_OTHER): Payer: Medicaid Other | Admitting: Certified Nurse Midwife

## 2014-03-26 ENCOUNTER — Encounter: Payer: Self-pay | Admitting: Certified Nurse Midwife

## 2014-03-26 ENCOUNTER — Encounter (HOSPITAL_COMMUNITY): Payer: Self-pay | Admitting: *Deleted

## 2014-03-26 VITALS — BP 102/69 | HR 70 | Temp 98.6°F | Ht 59.0 in | Wt 122.0 lb

## 2014-03-26 DIAGNOSIS — O9989 Other specified diseases and conditions complicating pregnancy, childbirth and the puerperium: Secondary | ICD-10-CM | POA: Insufficient documentation

## 2014-03-26 DIAGNOSIS — O211 Hyperemesis gravidarum with metabolic disturbance: Secondary | ICD-10-CM | POA: Insufficient documentation

## 2014-03-26 DIAGNOSIS — O26899 Other specified pregnancy related conditions, unspecified trimester: Secondary | ICD-10-CM

## 2014-03-26 DIAGNOSIS — R109 Unspecified abdominal pain: Secondary | ICD-10-CM

## 2014-03-26 DIAGNOSIS — Z87891 Personal history of nicotine dependence: Secondary | ICD-10-CM | POA: Diagnosis not present

## 2014-03-26 DIAGNOSIS — O210 Mild hyperemesis gravidarum: Secondary | ICD-10-CM

## 2014-03-26 DIAGNOSIS — Z3A01 Less than 8 weeks gestation of pregnancy: Secondary | ICD-10-CM | POA: Insufficient documentation

## 2014-03-26 DIAGNOSIS — R1030 Lower abdominal pain, unspecified: Secondary | ICD-10-CM | POA: Diagnosis not present

## 2014-03-26 LAB — COMPREHENSIVE METABOLIC PANEL
ALT: 16 U/L (ref 0–35)
AST: 21 U/L (ref 0–37)
Albumin: 4.3 g/dL (ref 3.5–5.2)
Alkaline Phosphatase: 49 U/L (ref 39–117)
Anion gap: 10 (ref 5–15)
BUN: 11 mg/dL (ref 6–23)
CALCIUM: 9.5 mg/dL (ref 8.4–10.5)
CO2: 25 mmol/L (ref 19–32)
Chloride: 100 mmol/L (ref 96–112)
Creatinine, Ser: 0.67 mg/dL (ref 0.50–1.10)
GFR calc Af Amer: >90 mL/min (ref >90)
GFR calc non Af Amer: >90 mL/min (ref >90)
Glucose, Bld: 84 mg/dL (ref 70–99)
Potassium: 3.6 mmol/L (ref 3.5–5.1)
SODIUM: 135 mmol/L (ref 135–145)
TOTAL PROTEIN: 7.3 g/dL (ref 6.0–8.3)
Total Bilirubin: 0.7 mg/dL (ref 0.3–1.2)

## 2014-03-26 LAB — CBC
HCT: 33.7 % — ABNORMAL LOW (ref 36.0–46.0)
Hemoglobin: 12 g/dL (ref 12.0–15.0)
MCH: 30.5 pg (ref 26.0–34.0)
MCHC: 35.6 g/dL (ref 30.0–36.0)
MCV: 85.8 fL (ref 78.0–100.0)
Platelets: 232 10*3/uL (ref 150–400)
RBC: 3.93 MIL/uL (ref 3.87–5.11)
RDW: 12.5 % (ref 11.5–15.5)
WBC: 9.9 10*3/uL (ref 4.0–10.5)

## 2014-03-26 LAB — URINALYSIS, ROUTINE W REFLEX MICROSCOPIC
Bilirubin Urine: NEGATIVE
GLUCOSE, UA: NEGATIVE mg/dL
Hgb urine dipstick: NEGATIVE
Ketones, ur: NEGATIVE mg/dL
LEUKOCYTES UA: NEGATIVE
Nitrite: NEGATIVE
PH: 6.5 (ref 5.0–8.0)
Protein, ur: NEGATIVE mg/dL
SPECIFIC GRAVITY, URINE: 1.015 (ref 1.005–1.030)
Urobilinogen, UA: 0.2 mg/dL (ref 0.0–1.0)

## 2014-03-26 MED ORDER — DOXYLAMINE-PYRIDOXINE 10-10 MG PO TBEC: 1.0000 | DELAYED_RELEASE_TABLET | Freq: Three times a day (TID) | ORAL | Status: DC

## 2014-03-26 MED ORDER — ONDANSETRON 8 MG PO TBDP
8.0000 mg | ORAL_TABLET | Freq: Once | ORAL | Status: AC
  Administered 2014-03-26: 8 mg via ORAL
  Filled 2014-03-26: qty 1

## 2014-03-26 MED ORDER — ONDANSETRON 8 MG PO TBDP: 8.0000 mg | ORAL_TABLET | Freq: Three times a day (TID) | ORAL | Status: DC | PRN

## 2014-03-26 MED ORDER — PROMETHAZINE HCL 25 MG/ML IJ SOLN
25.0000 mg | Freq: Once | INTRAVENOUS | Status: AC
  Administered 2014-03-26: 25 mg via INTRAVENOUS
  Filled 2014-03-26: qty 1

## 2014-03-26 NOTE — MAU Note (Addendum)
Sent over from [facility], ongoing nausea and vomiting. can't eat anything.  Pulling pain in lower abd, started today.

## 2014-03-26 NOTE — MAU Provider Note (Signed)
Chief Complaint: Morning Sickness and Abdominal Pain   First Provider Initiated Contact with Patient 03/26/14 1722     SUBJECTIVE HPI: Tracie Garcia is a 29 y.o. [id number] at 5916w1d by LMP who [facility] exacerbation of hyperemesis and abdominal pain in pregnancy. Patient has been taking Phenergan Reglan and Zofran without relief nausea and vomiting. The friend had been working, but has not worked anymore the past 2 days. Patient states Zofran ODT given in maternity admissions has helped more than anything. She has been counseled on the possible risks of using Zofran in early pregnancy and elects to continue using it.   IUP confirmed on ultrasound 03/15/2014, but viability has not been confirmed. GC/Chlamydia and wet prep negative. Declines repeat pelvic exam.  Prepregnancy weight 121.  Past Medical History  Diagnosis Date  . Abnormal Pap smear   . Gonorrhea   . Chlamydia    OB History  Gravida Para Term Preterm AB SAB TAB Ectopic Multiple Living  6 2 2  0 3 0 3 0 0 2    # Outcome Date GA Lbr Len/2nd Weight Sex Delivery Anes PTL Lv  6 Current           5 TAB           4 TAB           3 TAB           2 Term      Vag-Spont     1 Term      Vag-Spont        Past Surgical History  Procedure Laterality Date  . Colposcopy    . Dilation and curettage of uterus      tab x2  . Therapeutic abortion     History   Social History  . Marital Status: Single    Spouse Name: N/A  . Number of Children: N/A  . Years of Education: N/A   Occupational History  . Not on file.   Social History Main Topics  . Smoking status: Former Smoker -- 0.25 packs/day    Types: Cigarettes  . Smokeless tobacco: Never Used  . Alcohol Use: No  . Drug Use: No     Comment: Occassionaly  . Sexual Activity:    Partners: Male    Pharmacist, hospitalBirth Control/ Protection: None   Other Topics Concern  . Not on file   Social History Narrative   No current facility-administered  medications on file prior to encounter.   Current Outpatient Prescriptions on File Prior to Encounter  Medication Sig Dispense Refill  . buPROPion (WELLBUTRIN SR) 150 MG 12 hr tablet Take 1 tablet po qam for 7 days, then increase to 1 po bid.  Take last dose no later than 5pm. (Patient taking differently: Take 150 mg by mouth 2 (two) times daily. Take 1 tablet po qam for 7 days, then increase to 1 po bid.  Take last dose no later than 5pm.) 30 tablet 11  . levothyroxine (SYNTHROID, LEVOTHROID) 75 MCG tablet Take 75 mcg by mouth daily.     . Prenat-FeCbn-FeAspGl-FA-Omega (OB COMPLETE PETITE) 35-5-1-200 MG CAPS Take 1 capsule by mouth daily before breakfast. 90 capsule 3  . dimenhyDRINATE (DRAMAMINE) 50 MG tablet Take 50 mg by mouth every 6 (six) hours as needed for nausea.    . metoCLOPramide (REGLAN) 10 MG tablet Take 1 tablet (10 mg total) by mouth every 6 (six) hours. (Patient not taking: Reported on 03/26/2014) 30  tablet 0  . ondansetron (ZOFRAN) 4 MG tablet Take 1 tablet (4 mg total) by mouth every 6 (six) hours. (Patient not taking: Reported on 03/26/2014) 12 tablet 0  . promethazine (PHENERGAN) 25 MG tablet Take 1 tablet (25 mg total) by mouth every 6 (six) hours as needed for nausea or vomiting. (Patient not taking: Reported on 03/26/2014) 30 tablet 0  . terconazole (TERAZOL 7) 0.4 % vaginal cream Place 1 applicator vaginally at bedtime. Apply to external surface as well. (Patient not taking: Reported on 03/21/2014) 45 g 0   No Known Allergies  Review of Systems  Constitutional: Negative for fever and chills.  Gastrointestinal: Positive for nausea, vomiting and abdominal pain. Negative for diarrhea and constipation.  Genitourinary: Negative for dysuria, urgency, frequency, hematuria and flank pain.       Negative for vaginal bleeding or vaginal discharge.  Neurological: Positive for weakness.    OBJECTIVE Blood pressure 103/62, pulse 70, temperature 98.5 F (36.9 C), temperature source  Axillary, resp. rate 18, height  (1.499 m), weight 123 lb (55.792 kg), last menstrual period 02/11/2014. GENERAL: Well-developed, well-nourished female in no acute distress. Actively vomiting small amounts of water. HEART: normal rate RESP: normal effort GI: Abdomen soft, mildly tender across the entire lower abdomen. Positive bowel sounds 4. MS: Nontender, no edema NEURO: Alert and oriented SPECULUM EXAM: Declined  LAB RESULTS Results for orders placed or performed during the hospital encounter of 03/26/14 (from the past 24 hour(s))  Urinalysis, Routine w reflex microscopic     Status: None   Collection Time: 03/26/14  4:33 PM  Result Value Ref Range   Color, Urine YELLOW YELLOW   APPearance CLEAR CLEAR   Specific Gravity, Urine 1.015 1.005 - 1.030   pH 6.5 5.0 - 8.0   Glucose, UA NEGATIVE NEGATIVE mg/dL   Hgb urine dipstick NEGATIVE NEGATIVE   Bilirubin Urine NEGATIVE NEGATIVE   Ketones, ur NEGATIVE NEGATIVE mg/dL   Protein, ur NEGATIVE NEGATIVE mg/dL   Urobilinogen, UA 0.2 0.0 - 1.0 mg/dL   Nitrite NEGATIVE NEGATIVE   Leukocytes, UA NEGATIVE NEGATIVE  CBC     Status: Abnormal   Collection Time: 03/26/14  7:10 PM  Result Value Ref Range   WBC 9.9 4.0 - 10.5 K/uL   RBC 3.93 3.87 - 5.11 MIL/uL   Hemoglobin 12.0 12.0 - 15.0 g/dL   HCT 16.1 (L) 09.6 - 04.5 %   MCV 85.8 78.0 - 100.0 fL   MCH 30.5 26.0 - 34.0 pg   MCHC 35.6 30.0 - 36.0 g/dL   RDW 40.9 81.1 - 91.4 %   Platelets 232 150 - 400 K/uL  Comprehensive metabolic panel     Status: None   Collection Time: 03/26/14  7:10 PM  Result Value Ref Range   Sodium 135 135 - 145 mmol/L   Potassium 3.6 3.5 - 5.1 mmol/L   Chloride 100 96 - 112 mmol/L   CO2 25 19 - 32 mmol/L   Glucose, Bld 84 70 - 99 mg/dL   BUN 11 6 - 23 mg/dL   Creatinine, Ser 7.82 0.50 - 1.10 mg/dL   Calcium 9.5 8.4 - 95.6 mg/dL   Total Protein 7.3 6.0 - 8.3 g/dL   Albumin 4.3 3.5 - 5.2 g/dL   AST 21 0 - 37 U/L   ALT 16 0 - 35 U/L   Alkaline  Phosphatase 49 39 - 117 U/L   Total Bilirubin 0.7 0.3 - 1.2 mg/dL   GFR calc non Af  Amer >90 >90 mL/min   GFR calc Af Amer >90 >90 mL/min   Anion gap 10 5 - 15    IMAGING US Ob Comp Less 14 Wks  03/15/2014   CLINICAL DATA:  Vaginal bleeding in first trimester of pregnancy, EGA [redacted] weeks 4 days by LMP of 02/11/2014 ; no quantitative beta HCG for correlation at this time  EXAM: OBSTETRIC <14 WK [place] AND TRANSVAGINAL OB US  TECHNIQUE: Both transabdominal and transvaginal ultrasound examinations were performed for complete evaluation of the gestation as well as the maternal uterus, adnexal regions, and pelvic cul-de-sac. Transvaginal technique was performed to assess early pregnancy.  COMPARISON:  None for this gestational  FINDINGS: Intrauterine gestational sac: Visualized/normal in shape.  Yolk sac:  Present  Embryo:  Not identified  Cardiac Activity: N/A  Heart Rate: N/A  bpm  MSD: 6.0  mm   5 w   1  d  Maternal uterus/adnexae:  No subchorionic hemorrhage.  RIGHT ovary normal size and morphology 2.6 x 1.7 x 2.2 cm.  LEFT ovary normal size and morphology 4.1 x 2.2 x 2.3 cm.  No adnexal masses or free pelvic fluid.  IMPRESSION: Gestational sac with yolk sac in uterus though no fetal pole is identified.  Remainder of exam unremarkable.   Electronically Signed   By: Ulyses Southward M.D.   On: 03/15/2014 17:54   US Ob Transvaginal  03/26/2014   CLINICAL DATA:  Abdominal pain in first trimester pregnancy.  EXAM: TRANSVAGINAL OB ULTRASOUND  TECHNIQUE: Transvaginal ultrasound was performed for complete evaluation of the gestation as well as the maternal uterus, adnexal regions, and pelvic cul-de-sac.  COMPARISON:  03/15/2014  FINDINGS: Intrauterine gestational sac: Visualized/normal in shape. No subchorionic hemorrhage is noted.  Yolk sac:  Present  Embryo:  Present  Cardiac Activity: Present  Heart Rate: 120 bpm  CRL:   7.6  mm   6 w 5 d                  [place] EDC: 11/14/2014  Maternal uterus/adnexae: The ovaries/adnexa  are unremarkable. No free pelvic fluid.  IMPRESSION: Single living intrauterine gestation at 6 weeks 5 days. No pathologic findings.   Electronically Signed   By: Marnee Spring M.D.   On: 03/26/2014 18:02   US Ob Transvaginal  03/15/2014   CLINICAL DATA:  Vaginal bleeding in first trimester of pregnancy, EGA [redacted] weeks 4 days by LMP of 02/11/2014 ; no quantitative beta HCG for correlation at this time  EXAM: OBSTETRIC <14 WK [place] AND TRANSVAGINAL OB US  TECHNIQUE: Both transabdominal and transvaginal ultrasound examinations were performed for complete evaluation of the gestation as well as the maternal uterus, adnexal regions, and pelvic cul-de-sac. Transvaginal technique was performed to assess early pregnancy.  COMPARISON:  None for this gestational  FINDINGS: Intrauterine gestational sac: Visualized/normal in shape.  Yolk sac:  Present  Embryo:  Not identified  Cardiac Activity: N/A  Heart Rate: N/A  bpm  MSD: 6.0  mm   5 w   1  d  Maternal uterus/adnexae:  No subchorionic hemorrhage.  RIGHT ovary normal size and morphology 2.6 x 1.7 x 2.2 cm.  LEFT ovary normal size and morphology 4.1 x 2.2 x 2.3 cm.  No adnexal masses or free pelvic fluid.  IMPRESSION: Gestational sac with yolk sac in uterus though no fetal pole is identified.  Remainder of exam unremarkable.   Electronically Signed   By: Ulyses Southward M.D.   On: 03/15/2014 17:54  MAU COURSE Zofran ODT given.  Patient continues to dry heave. Phenergan and IV fluids, CBC, CMP ordered.  Care of patient turned over to Vonzella Nipple, PA at 8 PM. IV fluids infusing.  ASSESSMENT 1. Abdominal pain affecting pregnancy, antepartum   2. Hyperemesis gravidarum, antepartum     PLAN Discharge home in stable condition. Rx for ODT Zofran given First trimester warning signs discussed      Follow-up Information    Follow up with Brock Bad, MD.   Specialty:  Obstetrics and Gynecology   Contact information:   [address] [place] [address] [phone]       Follow up On 04/09/2014.   Why:  As scheduled as scheduled or sooner as needed if symptoms worsen      Follow up with [facility] [place] [facility].   Why:  As needed in emergencies   Contact information:   [address] [id number] mc [place] [address] [phone]       Medication List    ASK your doctor about these medications        Acetaminophen-Guaifenesin 325-200 MG Tabs  Take 2 tablets by mouth once as needed (Used for cold symptoms).     buPROPion 150 MG 12 hr tablet  Commonly known as:  WELLBUTRIN SR  Take 1 tablet po qam for 7 days, then increase to 1 po bid.  Take last dose no later than 5pm.     dimenhyDRINATE 50 MG tablet  Commonly known as:  DRAMAMINE  Take 50 mg by mouth every 6 (six) hours as needed for nausea.     Doxylamine-Pyridoxine 10-10 MG Tbec  Commonly known as:  DICLEGIS  Take 1 tablet by mouth 3 (three) times daily. 1 tab in AM & Lunch.  Increase to 2 tablets at bedtime.     levothyroxine 75 MCG tablet  Commonly known as:  SYNTHROID, LEVOTHROID  Take 75 mcg by mouth daily.     metoCLOPramide 10 MG tablet  Commonly known as:  REGLAN  Take 1 tablet (10 mg total) by mouth every 6 (six) hours.     OB COMPLETE PETITE 35-5-1-200 MG Caps  Take 1 capsule by mouth daily before breakfast.     ondansetron 4 MG tablet  Commonly known as:  ZOFRAN  Take 1 tablet (4 mg total) by mouth every 6 (six) hours.     promethazine 25 MG tablet  Commonly known as:  PHENERGAN  Take 1 tablet (25 mg total) by mouth every 6 (six) hours as needed for nausea or vomiting.     pseudoephedrine 30 MG tablet  Commonly known as:  SUDAFED  Take 30 mg by mouth every 8 (eight) hours as needed for congestion.     terconazole 0.4 % vaginal cream  Commonly known as:  TERAZOL 7  Place 1 applicator vaginally at bedtime. Apply to external surface as well.        Marny Lowenstein,  PA-C 03/26/2014  9:00 PM

## 2014-03-26 NOTE — Discharge Instructions (Signed)
Hyperemesis Gravidarum °Hyperemesis gravidarum is a severe form of nausea and vomiting that happens during pregnancy. Hyperemesis is worse than morning sickness. It may cause you to have nausea or vomiting all day for many days. It may keep you from eating and drinking enough food and liquids. Hyperemesis usually occurs during the first half (the first 20 weeks) of pregnancy. It often goes away once a woman is in her second half of pregnancy. However, sometimes hyperemesis continues through an entire pregnancy.  °CAUSES  °The cause of this condition is not completely known but is thought to be related to changes in the body's hormones when pregnant. It could be from the high level of the pregnancy hormone or an increase in estrogen in the body.  °SIGNS AND SYMPTOMS  °· Severe nausea and vomiting. °· Nausea that does not go away. °· Vomiting that does not allow you to keep any food down. °· Weight loss and body fluid loss (dehydration). °· Having no desire to eat or not liking food you have previously enjoyed. °DIAGNOSIS  °Your health care provider will do a physical exam and ask you about your symptoms. He or she may also order blood tests and urine tests to make sure something else is not causing the problem.  °TREATMENT  °You may only need medicine to control the problem. If medicines do not control the nausea and vomiting, you will be treated in the hospital to prevent dehydration, increased acid in the blood (acidosis), weight loss, and changes in the electrolytes in your body that may harm the unborn baby (fetus). You may need IV fluids.  °HOME CARE INSTRUCTIONS  °· Only take over-the-counter or prescription medicines as directed by your health care provider. °· Try eating a couple of dry crackers or toast in the morning before getting out of bed. °· Avoid foods and smells that upset your stomach. °· Avoid fatty and spicy foods. °· Eat 5-6 small meals a day. °· Do not drink when eating meals. Drink between  meals. °· For snacks, eat high-protein foods, such as cheese. °· Eat or suck on things that have ginger in them. Ginger helps nausea. °· Avoid food preparation. The smell of food can spoil your appetite. °· Avoid iron pills and iron in your multivitamins until after 3-4 months of being pregnant. However, consult with your health care provider before stopping any prescribed iron pills. °SEEK MEDICAL CARE IF:  °· Your abdominal pain increases. °· You have a severe headache. °· You have vision problems. °· You are losing weight. °SEEK IMMEDIATE MEDICAL CARE IF:  °· You are unable to keep fluids down. °· You vomit blood. °· You have constant nausea and vomiting. °· You have excessive weakness. °· You have extreme thirst. °· You have dizziness or fainting. °· You have a fever or persistent symptoms for more than 2-3 days. °· You have a fever and your symptoms suddenly get worse. °MAKE SURE YOU:  °· Understand these instructions. °· Will watch your condition. °· Will get help right away if you are not doing well or get worse. °Document Released: 12/21/2004 Document Revised: 10/11/2012 Document Reviewed: 08/02/2012 °ExitCare® Patient Information ©2015 [employer]. This information is not intended to replace advice given to you by your health care provider. Make sure you discuss any questions you have with your health care provider. ° ° °Eating Plan for Hyperemesis Gravidarum °Severe cases of hyperemesis gravidarum can lead to dehydration and malnutrition. The hyperemesis eating plan is one way to lessen   the symptoms of nausea and vomiting. It is often used with prescribed medicines to control your symptoms.  °WHAT CAN I DO TO RELIEVE MY SYMPTOMS? °Listen to your body. Everyone is different and has different preferences. Find what works best for you. Some of the following things may help: °· Eat and drink slowly. °· Eat 5-6 small meals daily instead of 3 large meals.   °· Eat crackers before you get out of bed in the  morning.   °· Starchy foods are usually well tolerated (such as cereal, toast, bread, potatoes, pasta, rice, and pretzels).   °· Ginger may help with nausea. Add ¼ tsp ground ginger to hot tea or choose ginger tea.   °· Try drinking 100% fruit juice or an electrolyte drink. °· Continue to take your prenatal vitamins as directed by your health care provider. If you are having trouble taking your prenatal vitamins, talk with your health care provider about different options. °· Include at least 1 serving of protein with your meals and snacks (such as meats or poultry, beans, nuts, eggs, or yogurt). Try eating a protein-rich snack before bed (such as cheese and crackers or a half turkey or peanut butter sandwich). °WHAT THINGS SHOULD I AVOID TO REDUCE MY SYMPTOMS? °The following things may help reduce your symptoms: °· Avoid foods with strong smells. Try eating meals in well-ventilated areas that are free of odors. °· Avoid drinking water or other beverages with meals. Try not to drink anything less than 30 minutes before and after meals. °· Avoid drinking more than 1 cup of fluid at a time. °· Avoid fried or high-fat foods, such as butter and cream sauces. °· Avoid spicy foods. °· Avoid skipping meals the best you can. Nausea can be more intense on an empty stomach. If you cannot tolerate food at that time, do not force it. Try sucking on ice chips or other frozen items and make up the calories later. °· Avoid lying down within 2 hours after eating. °Document Released: 10/18/2006 Document Revised: 12/26/2012 Document Reviewed: 10/25/2012 °ExitCare® Patient Information ©2015 [employer]. This information is not intended to replace advice given to you by your health care provider. Make sure you discuss any questions you have with your health care provider. ° °

## 2014-03-26 NOTE — Telephone Encounter (Signed)
Pt called to office asking for return call for medication.   Return call to pt.   Pt states that she can no longer keep anything down, fluids or solids.  Pt states that she can tolerate Gaterade and water to some degree.  Pt states that water is now making her sick as well.   Pt has several N&V medications on med list.  Pt states that Zofran was helping but not currently.  Pt states that she did a little better after getting Zofran sublingual in hospital.  Pt would like to know what else she can do. After review, was advised to schedule pt for problem visit to get N&V under control as she has not had NOB visit for this pregnancy.  Call placed to pt morning of 03-26-14 and scheduled for 2:45 pm with Felisa Bonier. Den[place], CNM.

## 2014-03-26 NOTE — Progress Notes (Signed)
Patient ID: Tracie Garcia, female   DOB: [birth year], 29 y.o.   MRN: [id number]   Chief Complaint  Patient presents with  . Morning Sickness    N&V, unable to keep anything down. Current medication no longer working.    HPI Tracie Garcia is a 29 y.o. female.  Seen in clinic with hyperemesis gravidum.   Nausea and vomiting with exam.  Unable to keep fluids down for greater than 24 hours, has tried various medications and foods.  Unable to urinate.  Lower abdominal pain present.  Hyperactive BSX4.  Had an initial OB exam scheduled for later in April, moved up to the beginning of April.    HPI  Past Medical History  Diagnosis Date  . Abnormal Pap smear   . Gonorrhea   . Chlamydia     Past Surgical History  Procedure Laterality Date  . Colposcopy    . Dilation and curettage of uterus      tab x2  . Therapeutic abortion      Family History  Problem Relation Age of Onset  . Adopted: Yes    Social History History  Substance Use Topics  . Smoking status: Former Smoker -- 0.25 packs/day    Types: Cigarettes  . Smokeless tobacco: Never Used  . Alcohol Use: No    No Known Allergies  Current Outpatient Prescriptions  Medication Sig Dispense Refill  . Doxylamine-Pyridoxine (DICLEGIS) 10-10 MG TBEC Take 1 tablet by mouth 3 (three) times daily. 1 tab in AM & Lunch.  Increase to 2 tablets at bedtime. 100 tablet 4   No current facility-administered medications for this visit.    Review of Systems Review of Systems Constitutional: positive for fatigue and weight loss, patient reports fever, has been taking tylenol.  Respiratory: negative for cough and wheezing Cardiovascular: negative for chest pain, fatigue and palpitations Gastrointestinal: positive for abdominal pain and change in bowel habits Genitourinary:negative fir bleeding, dysuria Integument/breast: negative for nipple discharge Musculoskeletal:negative for myalgias Neurological: negative for gait  problems and tremors Behavioral/Psych: negative for abusive relationship, depression Endocrine: negative for temperature intolerance     Blood pressure 102/69, pulse 70, temperature 98.6 F (37 C), height 4\' 11"  (1.499 m), weight 55.339 kg (122 lb), last menstrual period 02/11/2014.  Physical Exam Physical Exam General:   alert  Skin:   no rash or abnormalities  Lungs:   clear to auscultation bilaterally  Heart:   regular rate and rhythm, S1, S2 normal, no murmur, click, rub or gallop  Breasts:   deferred  Abdomen:  BSX4, hyperactive, no organomegaly, soft, tender RLQ & LLQ   Pelvis:  deferred    100% of 152 min visit spent on counseling and coordination of care.   Data Reviewed Previous medical hx  Assessment     Hyperemesis Gravidum Dehydration  Unknown cause of lower abdominal pain    Plan   Sent to MAU via car, with instructions.    No orders of the defined types were placed in this encounter.   Meds ordered this encounter  Medications  . Doxylamine-Pyridoxine (DICLEGIS) 10-10 MG TBEC    Sig: Take 1 tablet by mouth 3 (three) times daily. 1 tab in AM & Lunch.  Increase to 2 tablets at bedtime.    Dispense:  100 tablet    Refill:  4    Possible management options include: other antiemetics.

## 2014-03-27 ENCOUNTER — Encounter (HOSPITAL_COMMUNITY): Payer: Self-pay | Admitting: *Deleted

## 2014-03-27 ENCOUNTER — Inpatient Hospital Stay (HOSPITAL_COMMUNITY)
Admission: AD | Admit: 2014-03-27 | Discharge: 2014-03-27 | Disposition: A | Discharge status: Home / Self Care | Payer: Medicaid Other | Source: Ambulatory Visit | Attending: Obstetrics and Gynecology | Admitting: Obstetrics and Gynecology

## 2014-03-27 DIAGNOSIS — O210 Mild hyperemesis gravidarum: Secondary | ICD-10-CM | POA: Diagnosis not present

## 2014-03-27 DIAGNOSIS — Z3A01 Less than 8 weeks gestation of pregnancy: Secondary | ICD-10-CM | POA: Diagnosis not present

## 2014-03-27 DIAGNOSIS — Z87891 Personal history of nicotine dependence: Secondary | ICD-10-CM | POA: Diagnosis not present

## 2014-03-27 DIAGNOSIS — Z79899 Other long term (current) drug therapy: Secondary | ICD-10-CM | POA: Insufficient documentation

## 2014-03-27 MED ORDER — METOCLOPRAMIDE HCL 10 MG PO TABS: 10.0000 mg | ORAL_TABLET | Freq: Three times a day (TID) | ORAL | Status: DC

## 2014-03-27 MED ORDER — LACTATED RINGERS IV BOLUS (SEPSIS)
1000.0000 mL | Freq: Once | INTRAVENOUS | Status: AC
  Administered 2014-03-27: 1000 mL via INTRAVENOUS

## 2014-03-27 MED ORDER — PROMETHAZINE HCL 25 MG/ML IJ SOLN
25.0000 mg | Freq: Once | INTRAVENOUS | Status: AC
  Administered 2014-03-27: 25 mg via INTRAVENOUS
  Filled 2014-03-27: qty 1

## 2014-03-27 NOTE — MAU Note (Signed)
Vomiting at least every hour since this morning, every time tries to eat.  Early pregnant. No fever , no diarrhea.  No bleeding. Can't pee.-since this morning was last time.

## 2014-03-27 NOTE — Discharge Instructions (Signed)
Take Zofran every 8 hours Take phenergan every 6 hours. You may use this medicine in the rectum if needed Start reglan 3 times per day   Eating Plan for Hyperemesis Gravidarum Severe cases of hyperemesis gravidarum can lead to dehydration and malnutrition. The hyperemesis eating plan is one way to lessen the symptoms of nausea and vomiting. It is often used with prescribed medicines to control your symptoms.  WHAT CAN I DO TO RELIEVE MY SYMPTOMS? Listen to your body. Everyone is different and has different preferences. Find what works best for you. Some of the following things may help:  Eat and drink slowly.  Eat 5-6 small meals daily instead of 3 large meals.   Eat crackers before you get out of bed in the morning.   Starchy foods are usually well tolerated (such as cereal, toast, bread, potatoes, pasta, rice, and pretzels).   Ginger may help with nausea. Add  tsp ground ginger to hot tea or choose ginger tea.   Try drinking 100% fruit juice or an electrolyte drink.  Continue to take your prenatal vitamins as directed by your health care provider. If you are having trouble taking your prenatal vitamins, talk with your health care provider about different options.  Include at least 1 serving of protein with your meals and snacks (such as meats or poultry, beans, nuts, eggs, or yogurt). Try eating a protein-rich snack before bed (such as cheese and crackers or a half [place] or peanut butter sandwich). WHAT THINGS SHOULD I AVOID TO REDUCE MY SYMPTOMS? The following things may help reduce your symptoms:  Avoid foods with strong smells. Try eating meals in well-ventilated areas that are free of odors.  Avoid drinking water or other beverages with meals. Try not to drink anything less than 30 minutes before and after meals.  Avoid drinking more than 1 cup of fluid at a time.  Avoid fried or high-fat foods, such as butter and cream sauces.  Avoid spicy foods.  Avoid skipping  meals the best you can. Nausea can be more intense on an empty stomach. If you cannot tolerate food at that time, do not force it. Try sucking on ice chips or other frozen items and make up the calories later.  Avoid lying down within 2 hours after eating. Document Released: 10/18/2006 Document Revised: 12/26/2012 Document Reviewed: 10/25/2012 [facility] Patient Information 2015 [place]. This information is not intended to replace advice given to you by your health care provider. Make sure you discuss any questions you have with your health care provider.

## 2014-03-27 NOTE — MAU Provider Note (Signed)
History     CSN: [id number]  Arrival date and time: 03/27/14 1941   First Provider Initiated Contact with Patient 03/27/14 2032      Chief Complaint  Patient presents with  . Emesis During Pregnancy   HPI Comments: Tracie Garcia is a 29 y.o. [id number] at [redacted]w[redacted]d who presents today with nausea and vomiting. She states that she has taken Zofran once today. She has not taken any other medications today. She has tried phenergan rectally, but states that it did not help. She states that the was taking phenergan and zofran together. She states that helped for a couple of days. She states that she had phenergan in her IV the last time she was here. She states that helped all day yesterday and through the night. She states that she took Zofran at 1000 today, and by 11:15 she was vomiting water. She states that she has been vomiting every hour since then.   Emesis  This is a recurrent problem. The current episode started in the past 7 days. The problem occurs more than 10 times per day. The problem has been unchanged. The emesis has an appearance of bile. There has been no fever. Pertinent negatives include no abdominal pain, diarrhea, fever or headaches. Treatments tried: zofran and phenergan  The treatment provided no relief.   Past Medical History  Diagnosis Date  . Abnormal Pap smear   . Gonorrhea   . Chlamydia     Past Surgical History  Procedure Laterality Date  . Colposcopy    . Dilation and curettage of uterus      tab x2  . Therapeutic abortion      Family History  Problem Relation Age of Onset  . Adopted: Yes    History  Substance Use Topics  . Smoking status: Former Smoker -- 0.25 packs/day    Types: Cigarettes  . Smokeless tobacco: Never Used  . Alcohol Use: No    Allergies: No Known Allergies  Prescriptions prior to admission  Medication Sig Dispense Refill Last Dose  . Acetaminophen-Guaifenesin 325-200 MG TABS Take 2 tablets by mouth once as needed (Used for  cold symptoms).   Past Week at Unknown time  . buPROPion (WELLBUTRIN SR) 150 MG 12 hr tablet Take 1 tablet po qam for 7 days, then increase to 1 po bid.  Take last dose no later than 5pm. (Patient taking differently: Take 150 mg by mouth 2 (two) times daily. Take 1 tablet po qam for 7 days, then increase to 1 po bid.  Take last dose no later than 5pm.) 30 tablet 11 Past Week at Unknown time  . dimenhyDRINATE (DRAMAMINE) 50 MG tablet Take 50 mg by mouth every 6 (six) hours as needed for nausea.   Not Taking  . Doxylamine-Pyridoxine (DICLEGIS) 10-10 MG TBEC Take 1 tablet by mouth 3 (three) times daily. 1 tab in AM & Lunch.  Increase to 2 tablets at bedtime. (Patient not taking: Reported on 03/26/2014) 100 tablet 4 Not Taking at Unknown time  . levothyroxine (SYNTHROID, LEVOTHROID) 75 MCG tablet Take 75 mcg by mouth daily.    03/25/2014 at 0800  . metoCLOPramide (REGLAN) 10 MG tablet Take 1 tablet (10 mg total) by mouth every 6 (six) hours. (Patient not taking: Reported on 03/26/2014) 30 tablet 0 Not Taking at Unknown time  . ondansetron (ZOFRAN ODT) 8 MG disintegrating tablet Take 1 tablet (8 mg total) by mouth every 8 (eight) hours as needed for nausea or vomiting. 20 tablet  0   . ondansetron (ZOFRAN) 4 MG tablet Take 1 tablet (4 mg total) by mouth every 6 (six) hours. (Patient not taking: Reported on 03/26/2014) 12 tablet 0 Not Taking at Unknown time  . Prenat-FeCbn-FeAspGl-FA-Omega (OB COMPLETE PETITE) 35-5-1-200 MG CAPS Take 1 capsule by mouth daily before breakfast. 90 capsule 3 03/25/2014 at 0800  . promethazine (PHENERGAN) 25 MG tablet Take 1 tablet (25 mg total) by mouth every 6 (six) hours as needed for nausea or vomiting. (Patient not taking: Reported on 03/26/2014) 30 tablet 0 Not Taking at Unknown time  . pseudoephedrine (SUDAFED) 30 MG tablet Take 30 mg by mouth every 8 (eight) hours as needed for congestion.   03/25/2014 at Unknown time    Review of Systems  Constitutional: Negative for fever.   Gastrointestinal: Positive for vomiting. Negative for abdominal pain and diarrhea.  Genitourinary:       Patient has not been able to urinate since this morning.   Neurological: Negative for headaches.   Physical Exam   Blood pressure 110/76, pulse 72, temperature 98.4 F (36.9 C), temperature source Oral, resp. rate 18, height 4\' 11"  (1.499 m), weight 55.396 kg (122 lb 2 oz), last menstrual period 02/11/2014, SpO2 99 %.  Physical Exam  Nursing note and vitals reviewed. Constitutional: She is oriented to person, place, and time. She appears well-developed and well-nourished. No distress.  Cardiovascular: Normal rate.   Respiratory: Effort normal.  GI: Soft. There is no tenderness.  Neurological: She is alert and oriented to person, place, and time.  Skin: Skin is warm and dry.  Psychiatric: She has a normal mood and affect.    MAU Course  Procedures  MDM  2310: Patient has had 1L D5LR with phenergan and 1L of LR. She has been tolerating PO at this time, and has had crackers and water while here.   Assessment and Plan   1. Hyperemesis affecting pregnancy, antepartum    DC home Return to MAU as needed FU with OB as planned  Follow-up Information    Follow up with Brock BadHARPER,CHARLES A, MD.   Specialty:  Obstetrics and Gynecology   Why:  As scheduled   Contact information:   [address] [place] [id number] [phone]        Tracie Garcia, Tracie Garcia 03/27/2014, 8:36 PM

## 2014-04-04 ENCOUNTER — Telehealth: Payer: Self-pay | Admitting: *Deleted

## 2014-04-04 NOTE — Telephone Encounter (Signed)
Patient c/o constipation. 11:30 Spoke to patient- her nausea is doing better, but she has not had a bowel movement in 4 days. Recommend patient use MOM until she has movement. Also recommend patient try warm prune juice- per Dr Clearance CootsHarper. Verified NOB appointment- patient to call back if no success within 24 hours.

## 2014-04-09 ENCOUNTER — Encounter: Payer: Self-pay | Admitting: Obstetrics

## 2014-04-09 ENCOUNTER — Ambulatory Visit (INDEPENDENT_AMBULATORY_CARE_PROVIDER_SITE_OTHER): Payer: Medicaid Other | Admitting: Obstetrics

## 2014-04-09 VITALS — BP 109/73 | HR 69 | Temp 97.0°F | Wt 124.0 lb

## 2014-04-09 DIAGNOSIS — O211 Hyperemesis gravidarum with metabolic disturbance: Secondary | ICD-10-CM | POA: Diagnosis not present

## 2014-04-09 DIAGNOSIS — Z3481 Encounter for supervision of other normal pregnancy, first trimester: Secondary | ICD-10-CM | POA: Diagnosis not present

## 2014-04-09 DIAGNOSIS — E039 Hypothyroidism, unspecified: Secondary | ICD-10-CM

## 2014-04-09 LAB — POCT URINALYSIS DIPSTICK
Bilirubin, UA: NEGATIVE
Glucose, UA: NEGATIVE
Ketones, UA: NEGATIVE
Leukocytes, UA: NEGATIVE
Nitrite, UA: NEGATIVE
PH UA: 7
Protein, UA: NEGATIVE
RBC UA: NEGATIVE
UROBILINOGEN UA: NEGATIVE

## 2014-04-09 NOTE — Progress Notes (Signed)
Subjective:    Tracie Garcia is being seen today for her first obstetrical visit.  This is a planned pregnancy. She is at [redacted]w[redacted]d gestation. Her obstetrical history is significant for none. Relationship with FOB: significant other, not living together. Patient does intend to breast feed. Pregnancy history fully reviewed.  The information documented in the HPI was reviewed and verified.  Menstrual History: OB History    Gravida Para Term Preterm AB TAB SAB Ectopic Multiple Living   0 3 3 0 0 0 2       Patient's last menstrual period was 02/11/2014.    Past Medical History  Diagnosis Date  . Abnormal Pap smear   . Gonorrhea   . Chlamydia     Past Surgical History  Procedure Laterality Date  . Colposcopy    . Dilation and curettage of uterus      tab x2  . Therapeutic abortion       (Not in a hospital admission) Allergies  Allergen Reactions  . Other Swelling    Monastat    History  Substance Use Topics  . Smoking status: Former Smoker -- 0.25 packs/day    Types: Cigarettes  . Smokeless tobacco: Never Used  . Alcohol Use: No    Family History  Problem Relation Age of Onset  . Adopted: Yes     Review of Systems Constitutional: negative for weight loss Gastrointestinal: negative for vomiting Genitourinary:negative for genital lesions and vaginal discharge and dysuria Musculoskeletal:negative for back pain Behavioral/Psych: negative for abusive relationship, depression, illegal drug usage and tobacco use    Objective:    BP 109/73 mmHg  Pulse 69  Temp(Src) 97 F (36.1 C)  Wt 124 lb (56.246 kg)  LMP 02/11/2014 General Appearance:    Alert, cooperative, no distress, appears stated age  Head:    Normocephalic, without obvious abnormality, atraumatic  Eyes:    PERRL, conjunctiva/corneas clear, EOM's intact, fundi    benign, both eyes  Ears:    Normal TM's and external ear canals, both ears  Nose:   Nares normal, septum midline, mucosa normal, no  drainage    or sinus tenderness  Throat:   Lips, mucosa, and tongue normal; teeth and gums normal  Neck:   Supple, symmetrical, trachea midline, no adenopathy;    thyroid:  no enlargement/tenderness/nodules; no carotid   bruit or JVD  Back:     Symmetric, no curvature, ROM normal, no CVA tenderness  Lungs:     Clear to auscultation bilaterally, respirations unlabored  Chest Wall:    No tenderness or deformity   Heart:    Regular rate and rhythm, S1 and S2 normal, no murmur, rub   or gallop  Breast Exam:    No tenderness, masses, or nipple abnormality  Abdomen:     Soft, non-tender, bowel sounds active all four quadrants,    no masses, no organomegaly  Genitalia:    Normal female without lesion, discharge or tenderness  Extremities:   Extremities normal, atraumatic, no cyanosis or edema  Pulses:   2+ and symmetric all extremities  Skin:   Skin color, texture, turgor normal, no rashes or lesions  Lymph nodes:   Cervical, supraclavicular, and axillary nodes normal  Neurologic:   CNII-XII intact, normal strength, sensation and reflexes    throughout      Lab Review Urine pregnancy test Labs reviewed yes Radiologic studies reviewed yes Assessment:    Pregnancy at [redacted]w[redacted]d weeks    Hyperemesis  Plan:     Instructions given for management of Hyperemesis Prenatal vitamins.  Counseling provided regarding continued use of seat belts, cessation of alcohol consumption, smoking or use of illicit drugs; infection precautions i.e., influenza/TDAP immunizations, toxoplasmosis,CMV, parvovirus, listeria and varicella; workplace safety, exercise during pregnancy; routine dental care, safe medications, sexual activity, hot tubs, saunas, pools, travel, caffeine use, fish and methlymercury, potential toxins, hair treatments, varicose veins Weight gain recommendations per IOM guidelines reviewed: underweight/BMI< 18.5--> gain 28 - 40 lbs; normal weight/BMI 18.5 - 24.9--> gain 25 - 35 lbs; overweight/BMI  25 - 29.9--> gain 15 - 25 lbs; obese/BMI >30->gain  11 - 20 lbs Problem list reviewed and updated. FIRST/CF mutation testing/NIPT/QUAD SCREEN/fragile X/Ashkenazi Jewish population testing/Spinal muscular atrophy discussed: requested. Role of ultrasound in pregnancy discussed; fetal survey: requested. Amniocentesis discussed: not indicated. VBAC calculator score: VBAC consent form provided No orders of the defined types were placed in this encounter.   Orders Placed This Encounter  Procedures  . Culture, OB Urine  . Obstetric panel  . HIV antibody  . Hemoglobinopathy evaluation  . Varicella zoster antibody, IgG  . Vit D  25 hydroxy (rtn osteoporosis monitoring)  . POCT urinalysis dipstick    Follow up in 4 weeks.

## 2014-04-10 LAB — OBSTETRIC PANEL
Antibody Screen: NEGATIVE
Basophils Absolute: 0 10*3/uL (ref 0.0–0.1)
Basophils Relative: 0 % (ref 0–1)
EOS PCT: 3 % (ref 0–5)
Eosinophils Absolute: 0.3 10*3/uL (ref 0.0–0.7)
HCT: 32.6 % — ABNORMAL LOW (ref 36.0–46.0)
HEMOGLOBIN: 11.5 g/dL — AB (ref 12.0–15.0)
Hepatitis B Surface Ag: NEGATIVE
LYMPHS ABS: 1.6 10*3/uL (ref 0.7–4.0)
Lymphocytes Relative: 18 % (ref 12–46)
MCH: 30.3 pg (ref 26.0–34.0)
MCHC: 35.3 g/dL (ref 30.0–36.0)
MCV: 85.8 fL (ref 78.0–100.0)
MPV: 10 fL (ref 8.6–12.4)
Monocytes Absolute: 1.1 10*3/uL — ABNORMAL HIGH (ref 0.1–1.0)
Monocytes Relative: 13 % — ABNORMAL HIGH (ref 3–12)
Neutro Abs: 5.7 10*3/uL (ref 1.7–7.7)
Neutrophils Relative %: 66 % (ref 43–77)
Platelets: 292 10*3/uL (ref 150–400)
RBC: 3.8 MIL/uL — AB (ref 3.87–5.11)
RDW: 13.3 % (ref 11.5–15.5)
RH TYPE: POSITIVE
Rubella: 2.37 Index — ABNORMAL HIGH (ref <0.90)
WBC: 8.7 10*3/uL (ref 4.0–10.5)

## 2014-04-10 LAB — VARICELLA ZOSTER ANTIBODY, IGG: Varicella IgG: 576.9 Index — ABNORMAL HIGH (ref <135.00)

## 2014-04-10 LAB — CULTURE, OB URINE
COLONY COUNT: NO GROWTH
Organism ID, Bacteria: NO GROWTH

## 2014-04-10 LAB — VITAMIN D 25 HYDROXY (VIT D DEFICIENCY, FRACTURES): Vit D, 25-Hydroxy: 9 ng/mL — ABNORMAL LOW (ref 30–100)

## 2014-04-10 LAB — HIV ANTIBODY (ROUTINE TESTING W REFLEX): HIV 1&2 Ab, 4th Generation: NONREACTIVE

## 2014-04-11 LAB — HEMOGLOBINOPATHY EVALUATION
HGB F QUANT: 0 % (ref 0.0–2.0)
Hemoglobin Other: 0 %
Hgb A2 Quant: 3 % (ref 2.2–3.2)
Hgb A: 58 % — ABNORMAL LOW (ref 96.8–97.8)
Hgb S Quant: 39 % — ABNORMAL HIGH

## 2014-04-15 ENCOUNTER — Other Ambulatory Visit: Payer: Self-pay | Admitting: *Deleted

## 2014-04-15 DIAGNOSIS — E559 Vitamin D deficiency, unspecified: Secondary | ICD-10-CM

## 2014-04-15 MED ORDER — OB COMPLETE PETITE 35-5-1-200 MG PO CAPS: 1.0000 | ORAL_CAPSULE | Freq: Every day | ORAL | Status: DC

## 2014-04-15 NOTE — Progress Notes (Signed)
Patient's on panel show vitamin d deficiency. Patient aware and prenatal vitamin sent to the pharmacy.

## 2014-04-17 ENCOUNTER — Telehealth: Payer: Self-pay | Admitting: *Deleted

## 2014-04-17 NOTE — Telephone Encounter (Signed)
Patient has been suffering with constipation. Patient was given instructions at her last appointment to take 3 colace daily and to take Miralax twice a day. Patient was seen on 4-5-116. Patient states she has still not had a bowel movement. Patient states she is also drinking prune juice and apple juice.  Per Dr. Clearance CootsHarper patient advised to increase her fluids no less than 6-8 glasses per day. Also advised to increase her fiber. Patient instructed to continue the Miralax and Colace as instructed previously. Patient to call the office by Friday of she has no results.

## 2014-04-22 ENCOUNTER — Other Ambulatory Visit: Payer: Self-pay | Admitting: Obstetrics

## 2014-04-22 NOTE — Telephone Encounter (Signed)
Patient in office since phone call.

## 2014-04-29 ENCOUNTER — Encounter: Payer: Self-pay | Admitting: Obstetrics

## 2014-05-07 ENCOUNTER — Ambulatory Visit (INDEPENDENT_AMBULATORY_CARE_PROVIDER_SITE_OTHER): Payer: Medicaid Other | Admitting: Obstetrics

## 2014-05-07 VITALS — BP 105/72 | HR 77 | Temp 98.0°F | Wt 133.0 lb

## 2014-05-07 DIAGNOSIS — Z3481 Encounter for supervision of other normal pregnancy, first trimester: Secondary | ICD-10-CM | POA: Diagnosis not present

## 2014-05-07 LAB — POCT URINALYSIS DIPSTICK
Bilirubin, UA: NEGATIVE
Blood, UA: NEGATIVE
Glucose, UA: NEGATIVE
Ketones, UA: NEGATIVE
LEUKOCYTES UA: NEGATIVE
Nitrite, UA: NEGATIVE
PH UA: 8
PROTEIN UA: NEGATIVE
Spec Grav, UA: <=1.005
Urobilinogen, UA: NEGATIVE

## 2014-05-09 ENCOUNTER — Encounter: Payer: Self-pay | Admitting: Obstetrics

## 2014-05-09 NOTE — Progress Notes (Signed)
  Subjective:    Tracie Garcia is a 29 y.o. female being seen today for her obstetrical visit. She is at 7486w3d gestation. Patient reports: no complaints.  Problem List Items Addressed This Visit    None    Visit Diagnoses    Encounter for supervision of other normal pregnancy in first trimester    -  Primary    Relevant Orders    POCT urinalysis dipstick (Completed)      Patient Active Problem List   Diagnosis Date Noted  . Hyperemesis gravidarum before end of [redacted] week gestation, dehydration 03/26/2014  . Thrombosed external hemorrhoids 07/20/2012    Objective:     BP 105/72 mmHg  Pulse 77  Temp(Src) 98 F (36.7 C)  Wt 133 lb (60.328 kg)  LMP 02/11/2014 Uterine Size: Below umbilicus     Assessment:    Pregnancy @ 7586w3d  weeks Doing well    Plan:    Problem list reviewed and updated. Labs reviewed.  Follow up in 4 weeks. FIRST/CF mutation testing/NIPT/QUAD SCREEN/fragile X/Ashkenazi Jewish population testing/Spinal muscular atrophy discussed: requested. Role of ultrasound in pregnancy discussed; fetal survey: requested. Amniocentesis discussed: not indicated.

## 2014-05-25 ENCOUNTER — Encounter (HOSPITAL_COMMUNITY): Payer: Self-pay | Admitting: *Deleted

## 2014-05-25 ENCOUNTER — Inpatient Hospital Stay (HOSPITAL_COMMUNITY)
Admission: AD | Admit: 2014-05-25 | Discharge: 2014-05-26 | Disposition: A | Discharge status: Home / Self Care | Payer: Medicaid Other | Source: Ambulatory Visit | Attending: Obstetrics | Admitting: Obstetrics

## 2014-05-25 DIAGNOSIS — B9689 Other specified bacterial agents as the cause of diseases classified elsewhere: Secondary | ICD-10-CM | POA: Diagnosis not present

## 2014-05-25 DIAGNOSIS — Z79899 Other long term (current) drug therapy: Secondary | ICD-10-CM | POA: Insufficient documentation

## 2014-05-25 DIAGNOSIS — G43001 Migraine without aura, not intractable, with status migrainosus: Secondary | ICD-10-CM

## 2014-05-25 DIAGNOSIS — N760 Acute vaginitis: Secondary | ICD-10-CM | POA: Insufficient documentation

## 2014-05-25 DIAGNOSIS — Z87891 Personal history of nicotine dependence: Secondary | ICD-10-CM | POA: Diagnosis not present

## 2014-05-25 DIAGNOSIS — R102 Pelvic and perineal pain: Secondary | ICD-10-CM | POA: Diagnosis not present

## 2014-05-25 DIAGNOSIS — R51 Headache: Secondary | ICD-10-CM | POA: Diagnosis present

## 2014-05-25 DIAGNOSIS — G43909 Migraine, unspecified, not intractable, without status migrainosus: Secondary | ICD-10-CM | POA: Diagnosis not present

## 2014-05-25 HISTORY — DX: Headache: R51

## 2014-05-25 HISTORY — DX: Headache, unspecified: R51.9

## 2014-05-25 LAB — URINALYSIS, ROUTINE W REFLEX MICROSCOPIC
Bilirubin Urine: NEGATIVE
Glucose, UA: NEGATIVE mg/dL
Hgb urine dipstick: NEGATIVE
KETONES UR: NEGATIVE mg/dL
Leukocytes, UA: NEGATIVE
Nitrite: NEGATIVE
PROTEIN: NEGATIVE mg/dL
SPECIFIC GRAVITY, URINE: 1.02 (ref 1.005–1.030)
UROBILINOGEN UA: 0.2 mg/dL (ref 0.0–1.0)
pH: 5.5 (ref 5.0–8.0)

## 2014-05-25 LAB — WET PREP, GENITAL
Trich, Wet Prep: NONE SEEN
Yeast Wet Prep HPF POC: NONE SEEN

## 2014-05-25 MED ORDER — DIPHENHYDRAMINE HCL 50 MG/ML IJ SOLN
25.0000 mg | Freq: Once | INTRAMUSCULAR | Status: AC
  Administered 2014-05-25: 25 mg via INTRAVENOUS
  Filled 2014-05-25: qty 1

## 2014-05-25 MED ORDER — DEXAMETHASONE SODIUM PHOSPHATE 10 MG/ML IJ SOLN
10.0000 mg | Freq: Once | INTRAMUSCULAR | Status: AC
  Administered 2014-05-25: 10 mg via INTRAVENOUS
  Filled 2014-05-25: qty 1

## 2014-05-25 MED ORDER — SODIUM CHLORIDE 0.9 % IV SOLN
INTRAVENOUS | Status: DC
  Administered 2014-05-25: via INTRAVENOUS

## 2014-05-25 MED ORDER — METOCLOPRAMIDE HCL 5 MG/ML IJ SOLN
10.0000 mg | Freq: Once | INTRAMUSCULAR | Status: AC
  Administered 2014-05-25: 10 mg via INTRAVENOUS
  Filled 2014-05-25: qty 2

## 2014-05-25 NOTE — MAU Note (Signed)
I have had migraine last 2 days. XST tylenol not helping. Nausea due to pregnancy. Random sharp pains shooting from vagina.

## 2014-05-26 DIAGNOSIS — G43001 Migraine without aura, not intractable, with status migrainosus: Secondary | ICD-10-CM | POA: Diagnosis not present

## 2014-05-26 MED ORDER — METRONIDAZOLE 500 MG PO TABS: 500.0000 mg | ORAL_TABLET | Freq: Two times a day (BID) | ORAL | Status: DC

## 2014-05-26 NOTE — Progress Notes (Signed)
Written and verbal d/c instructions given and understanding voiced. 

## 2014-05-26 NOTE — MAU Provider Note (Signed)
History     CSN: [id number]  Arrival date and time: 05/25/14 2254   First Provider Initiated Contact with Patient 05/25/14 2317      Chief Complaint  Patient presents with  . Headache  . Abdominal Pain   Headache  This is a new problem. The current episode started yesterday. The problem occurs constantly. The problem has been gradually worsening. The pain is located in the retro-orbital region. The pain does not radiate. The pain quality is similar to prior headaches. The quality of the pain is described as throbbing. The pain is at a severity of 6/10. The pain is moderate. Associated symptoms include abdominal pain and photophobia. Pertinent negatives include no fever, nausea or vomiting. The symptoms are aggravated by bright light. She has tried nothing for the symptoms. Her past medical history is significant for migraine headaches.  Abdominal Pain Associated symptoms include headaches. Pertinent negatives include no dysuria, fever, nausea or vomiting.    Marland Kitchenage [id number]  presents to the MAU stating that she has had a migraine for 2 days and it gtting worse. Her eyes are sensitive to light. She is also complaining og abdominal pain that radiates to her groin area and describes it to be sharp in nature. Denies vaginal bleeding, LOF.  Past Medical History  Diagnosis Date  . Abnormal Pap smear   . Gonorrhea   . Chlamydia   . Headache     Past Surgical History  Procedure Laterality Date  . Colposcopy    . Dilation and curettage of uterus      tab x2  . Therapeutic abortion      Family History  Problem Relation Age of Onset  . Adopted: Yes  . Alcohol abuse Neg Hx     History  Substance Use Topics  . Smoking status: Former Smoker -- 0.25 packs/day    Types: Cigarettes  . Smokeless tobacco: Never Used  . Alcohol Use: No    Allergies:  Allergies  Allergen Reactions  . Other Swelling    Monastat    Prescriptions prior to admission  Medication Sig Dispense  Refill Last Dose  . acetaminophen (TYLENOL) 500 MG tablet Take 1,000 mg by mouth every 6 (six) hours as needed.   05/25/2014 at 1900  . buPROPion (WELLBUTRIN SR) 150 MG 12 hr tablet Take 1 tablet po qam for 7 days, then increase to 1 po bid.  Take last dose no later than 5pm. (Patient not taking: Reported on 04/09/2014) 30 tablet 11 Not Taking  . Doxylamine-Pyridoxine (DICLEGIS) 10-10 MG TBEC Take 1 tablet by mouth 3 (three) times daily. 1 tab in AM & Lunch.  Increase to 2 tablets at bedtime. (Patient not taking: Reported on 03/26/2014) 100 tablet 4 Not Taking  . levothyroxine (SYNTHROID, LEVOTHROID) 75 MCG tablet Take 75 mcg by mouth daily.    Taking  . metoCLOPramide (REGLAN) 10 MG tablet Take 1 tablet (10 mg total) by mouth 3 (three) times daily before meals. (Patient not taking: Reported on 04/09/2014) 90 tablet 1 Not Taking  . ondansetron (ZOFRAN ODT) 8 MG disintegrating tablet Take 1 tablet (8 mg total) by mouth every 8 (eight) hours as needed for nausea or vomiting. (Patient not taking: Reported on 04/09/2014) 20 tablet 0 Not Taking  . Prenat-FeCbn-FeAspGl-FA-Omega (OB COMPLETE PETITE) 35-5-1-200 MG CAPS Take 1 capsule by mouth daily. 30 capsule 11   . promethazine (PHENERGAN) 25 MG tablet Take 1 tablet (25 mg total) by mouth every 6 (six) hours as needed for nausea  or vomiting. (Patient not taking: Reported on 03/26/2014) 30 tablet 0 Not Taking    Review of Systems  Constitutional: Negative for fever.  Eyes: Positive for photophobia.  Gastrointestinal: Positive for abdominal pain. Negative for nausea and vomiting.  Genitourinary: Negative for dysuria.  Neurological: Positive for headaches.  All other systems reviewed and are negative.  Physical Exam   Blood pressure 119/70, pulse 86, temperature 98.2 F (36.8 C), resp. rate 18, height 4\' 11"  (1.499 m), weight 62.596 kg (138 lb), last menstrual period 02/11/2014.  Physical Exam  Nursing note and vitals reviewed. Constitutional: She is  oriented to person, place, and time. She appears well-developed and well-nourished. No distress.  HENT:  Head: Normocephalic and atraumatic.  Neck: Normal range of motion.  Cardiovascular: Normal rate.   Respiratory: Effort normal. No respiratory distress.  GI: Soft. She exhibits no mass. There is no tenderness. There is no rebound and no guarding.  Musculoskeletal: Normal range of motion.  Neurological: She is alert and oriented to person, place, and time.  Skin: Skin is warm and dry.  Psychiatric: She has a normal mood and affect. Her behavior is normal. Judgment and thought content normal.   Positive FHT's Results for orders placed or performed during the hospital encounter of 05/25/14 (from the past 24 hour(s))  Urinalysis, Routine w reflex microscopic     Status: None   Collection Time: 05/25/14 11:20 PM  Result Value Ref Range   Color, Urine YELLOW YELLOW   APPearance CLEAR CLEAR   Specific Gravity, Urine 1.020 1.005 - 1.030   pH 5.5 5.0 - 8.0   Glucose, UA NEGATIVE NEGATIVE mg/dL   Hgb urine dipstick NEGATIVE NEGATIVE   Bilirubin Urine NEGATIVE NEGATIVE   Ketones, ur NEGATIVE NEGATIVE mg/dL   Protein, ur NEGATIVE NEGATIVE mg/dL   Urobilinogen, UA 0.2 0.0 - 1.0 mg/dL   Nitrite NEGATIVE NEGATIVE   Leukocytes, UA NEGATIVE NEGATIVE  Wet prep, genital     Status: Abnormal   Collection Time: 05/25/14 11:25 PM  Result Value Ref Range   Yeast Wet Prep HPF POC NONE SEEN NONE SEEN   Trich, Wet Prep NONE SEEN NONE SEEN   Clue Cells Wet Prep HPF POC FEW (A) NONE SEEN   WBC, Wet Prep HPF POC FEW (A) NONE SEEN   MAU Course  Procedures  MDM Pt has history iof migraine headaches and I will give her headache protocol with IVF here in the MAU. She describes what is probable round ligament pain. Wet prep reveals clue cells and she will be treated with flagyl and discharged to home.  Assessment and Plan  Migraine Bacterial Vaginosis Round Ligament pain  Flagyl 500mg  BID x7  d Discharge to home  [facility] CenterClemmons,Lori Grissett 05/26/2014, 12:45 AM

## 2014-05-26 NOTE — Discharge Instructions (Signed)
Recurrent Migraine Headache A migraine headache is very bad, throbbing pain on one or both sides of your head. Recurrent migraines keep coming back. Talk to your doctor about what things may bring on (trigger) your migraine headaches. HOME CARE  Only take medicines as told by your doctor.  Lie down in a dark, quiet room when you have a migraine.  Keep a journal to find out if certain things bring on migraine headaches. For example, write down:  What you eat and drink.  How much sleep you get.  Any change to your diet or medicines.  Lessen how much alcohol you drink.  Quit smoking if you smoke.  Get enough sleep.  Lessen any stress in your life.  Keep lights dim if bright lights bother you or make your migraines worse. GET HELP IF:  Medicine does not help your migraines.  Your pain keeps coming back.  You have a fever. GET HELP RIGHT AWAY IF:   Your migraine becomes really bad.  You have a stiff neck.  You have trouble seeing.  Your muscles are weak, or you lose muscle control.  You lose your balance or have trouble walking.  You feel like you will pass out (faint), or you pass out.  You have really bad symptoms that are different than your first symptoms. MAKE SURE YOU:   Understand these instructions.  Will watch your condition.  Will get help right away if you are not doing well or get worse. Document Released: 09/30/2007 Document Revised: 12/26/2012 Document Reviewed: 08/28/2012 [facility] Patient Information 2015 [place], [place]. This information is not intended to replace advice given to you by your health care provider. Make sure you discuss any questions you have with your health care provider. Bacterial Vaginosis Bacterial vaginosis is a vaginal infection that occurs when the normal balance of bacteria in the vagina is disrupted. It results from an overgrowth of certain bacteria. This is the most common vaginal infection in women of childbearing age.  Treatment is important to prevent complications, especially in pregnant women, as it can cause a premature delivery. CAUSES  Bacterial vaginosis is caused by an increase in harmful bacteria that are normally present in smaller amounts in the vagina. Several different kinds of bacteria can cause bacterial vaginosis. However, the reason that the condition develops is not fully understood. RISK FACTORS Certain activities or behaviors can put you at an increased risk of developing bacterial vaginosis, including:  Having a new sex partner or multiple sex partners.  Douching.  Using an intrauterine device (IUD) for contraception. Women do not get bacterial vaginosis from toilet seats, bedding, swimming pools, or contact with objects around them. SIGNS AND SYMPTOMS  Some women with bacterial vaginosis have no signs or symptoms. Common symptoms include:  Grey vaginal discharge.  A fishlike odor with discharge, especially after sexual intercourse.  Itching or burning of the vagina and vulva.  Burning or pain with urination. DIAGNOSIS  Your health care provider will take a medical history and examine the vagina for signs of bacterial vaginosis. A sample of vaginal fluid may be taken. Your health care provider will look at this sample under a microscope to check for bacteria and abnormal cells. A vaginal pH test may also be done.  TREATMENT  Bacterial vaginosis may be treated with antibiotic medicines. These may be given in the form of a pill or a vaginal cream. A second round of antibiotics may be prescribed if the condition comes back after treatment.  HOME CARE  INSTRUCTIONS   Only take over-the-counter or prescription medicines as directed by your health care provider.  If antibiotic medicine was prescribed, take it as directed. Make sure you finish it even if you start to feel better.  Do not have sex until treatment is completed.  Tell all sexual partners that you have a vaginal  infection. They should see their health care provider and be treated if they have problems, such as a mild rash or itching.  Practice safe sex by using condoms and only having one sex partner. SEEK MEDICAL CARE IF:   Your symptoms are not improving after 3 days of treatment.  You have increased discharge or pain.  You have a fever. MAKE SURE YOU:   Understand these instructions.  Will watch your condition.  Will get help right away if you are not doing well or get worse. FOR MORE INFORMATION  Centers for Disease Control and Prevention, Division of STD Prevention: SolutionApps.co.zawww.cdc.gov/std American Sexual Health Association (ASHA): www.ashastd.org  Document Released: 12/21/2004 Document Revised: 10/11/2012 Document Reviewed: 08/02/2012 [facility] Patient Information 2015 [place]. This information is not intended to replace advice given to you by your health care provider. Make sure you discuss any questions you have with your health care provider.

## 2014-06-04 ENCOUNTER — Ambulatory Visit (INDEPENDENT_AMBULATORY_CARE_PROVIDER_SITE_OTHER): Payer: Medicaid Other | Admitting: Obstetrics

## 2014-06-04 VITALS — BP 119/65 | HR 96 | Temp 99.4°F | Wt 140.0 lb

## 2014-06-04 DIAGNOSIS — G43011 Migraine without aura, intractable, with status migrainosus: Secondary | ICD-10-CM

## 2014-06-04 DIAGNOSIS — Z3482 Encounter for supervision of other normal pregnancy, second trimester: Secondary | ICD-10-CM

## 2014-06-04 LAB — POCT URINALYSIS DIPSTICK
Bilirubin, UA: NEGATIVE
Ketones, UA: NEGATIVE
Leukocytes, UA: NEGATIVE
NITRITE UA: NEGATIVE
PH UA: 6
Protein, UA: NEGATIVE
RBC UA: NEGATIVE
SPEC GRAV UA: 1.01
Urobilinogen, UA: NEGATIVE

## 2014-06-04 MED ORDER — BUTALBITAL-APAP-CAFFEINE 50-325-40 MG PO TABS: 2.0000 | ORAL_TABLET | Freq: Four times a day (QID) | ORAL | Status: DC | PRN

## 2014-06-05 ENCOUNTER — Encounter: Payer: Self-pay | Admitting: Obstetrics

## 2014-06-05 NOTE — Progress Notes (Signed)
  Subjective:    Tracie Garcia is a 29 y.o. female being seen today for her obstetrical visit. She is at 7352w2d gestation. Patient reports: no complaints.  Problem List Items Addressed This Visit    None    Visit Diagnoses    Encounter for supervision of other normal pregnancy in second trimester    -  Primary    Relevant Orders    POCT urinalysis dipstick (Completed)    AFP, Quad Screen    US OB Comp + 14 Wk    Intractable migraine without aura and with status migrainosus        Relevant Medications    butalbital-acetaminophen-caffeine (FIORICET) 50-325-40 MG per tablet      Patient Active Problem List   Diagnosis Date Noted  . Hyperemesis gravidarum before end of [redacted] week gestation, dehydration 03/26/2014  . Thrombosed external hemorrhoids 07/20/2012    Objective:     BP 119/65 mmHg  Pulse 96  Temp(Src) 99.4 F (37.4 C)  Wt 140 lb (63.504 kg)  LMP 02/11/2014 Uterine Size: Below umbilicus     Assessment:    Pregnancy @ 2052w2d  weeks  H/O Migraine HA's  Plan:   Fioricet Rx.   Problem list reviewed and updated. Labs reviewed.  Follow up in 4 weeks. FIRST/CF mutation testing/NIPT/QUAD SCREEN/fragile X/Ashkenazi Jewish population testing/Spinal muscular atrophy discussed: requested. Role of ultrasound in pregnancy discussed; fetal survey: requested. Amniocentesis discussed: not indicated.

## 2014-06-06 LAB — AFP, QUAD SCREEN
AFP: 44.1 ng/mL
Age Alone: 1:907 {titer}
Curr Gest Age: 16.5 wks.days
HCG TOTAL: 17.62 [IU]/mL
INH: 143.9 pg/mL
Interpretation-AFP: NEGATIVE
MoM for AFP: 0.94
MoM for INH: 0.78
MoM for hCG: 0.4
OPEN SPINA BIFIDA: NEGATIVE
Osb Risk: 1:32600 {titer}
Tri 18 Scr Risk Est: NEGATIVE
UE3 VALUE: 0.52 ng/mL
uE3 Mom: 0.51

## 2014-06-10 ENCOUNTER — Telehealth: Payer: Self-pay | Admitting: *Deleted

## 2014-06-10 ENCOUNTER — Ambulatory Visit: Payer: Medicaid Other | Admitting: Obstetrics

## 2014-06-10 DIAGNOSIS — B373 Candidiasis of vulva and vagina: Secondary | ICD-10-CM

## 2014-06-10 DIAGNOSIS — B3731 Acute candidiasis of vulva and vagina: Secondary | ICD-10-CM

## 2014-06-10 MED ORDER — TERCONAZOLE 0.4 % VA CREA: 1.0000 | TOPICAL_CREAM | Freq: Every day | VAGINAL | Status: DC

## 2014-06-10 NOTE — Telephone Encounter (Signed)
Patient states she has a yeast infection and would like for us to call in a Rx. Patient is allergic to OTC yeast medication and has used Terazol 7 in the past without problem. OK to call in per Dr Clearance CootsHarper.

## 2014-06-21 ENCOUNTER — Encounter (HOSPITAL_COMMUNITY): Payer: Self-pay | Admitting: *Deleted

## 2014-06-21 ENCOUNTER — Inpatient Hospital Stay (HOSPITAL_COMMUNITY): Payer: Medicaid Other

## 2014-06-21 ENCOUNTER — Inpatient Hospital Stay (HOSPITAL_COMMUNITY)
Admission: AD | Admit: 2014-06-21 | Discharge: 2014-06-21 | Disposition: A | Discharge status: Home / Self Care | Payer: Medicaid Other | Source: Ambulatory Visit | Attending: Obstetrics | Admitting: Obstetrics

## 2014-06-21 DIAGNOSIS — Z3A18 18 weeks gestation of pregnancy: Secondary | ICD-10-CM | POA: Diagnosis not present

## 2014-06-21 DIAGNOSIS — O9989 Other specified diseases and conditions complicating pregnancy, childbirth and the puerperium: Secondary | ICD-10-CM | POA: Insufficient documentation

## 2014-06-21 DIAGNOSIS — R109 Unspecified abdominal pain: Secondary | ICD-10-CM | POA: Diagnosis not present

## 2014-06-21 DIAGNOSIS — O9A212 Injury, poisoning and certain other consequences of external causes complicating pregnancy, second trimester: Secondary | ICD-10-CM

## 2014-06-21 DIAGNOSIS — O219 Vomiting of pregnancy, unspecified: Secondary | ICD-10-CM

## 2014-06-21 DIAGNOSIS — W109XXA Fall (on) (from) unspecified stairs and steps, initial encounter: Secondary | ICD-10-CM | POA: Insufficient documentation

## 2014-06-21 DIAGNOSIS — Z87891 Personal history of nicotine dependence: Secondary | ICD-10-CM | POA: Diagnosis not present

## 2014-06-21 DIAGNOSIS — O210 Mild hyperemesis gravidarum: Secondary | ICD-10-CM | POA: Insufficient documentation

## 2014-06-21 LAB — URINALYSIS, ROUTINE W REFLEX MICROSCOPIC
BILIRUBIN URINE: NEGATIVE
Glucose, UA: NEGATIVE mg/dL
Hgb urine dipstick: NEGATIVE
Ketones, ur: NEGATIVE mg/dL
Leukocytes, UA: NEGATIVE
NITRITE: NEGATIVE
PROTEIN: NEGATIVE mg/dL
Specific Gravity, Urine: 1.01 (ref 1.005–1.030)
Urobilinogen, UA: 1 mg/dL (ref 0.0–1.0)
pH: 8 (ref 5.0–8.0)

## 2014-06-21 MED ORDER — METOCLOPRAMIDE HCL 10 MG PO TABS: 10.0000 mg | ORAL_TABLET | Freq: Three times a day (TID) | ORAL | Status: DC | PRN

## 2014-06-21 MED ORDER — HYDROCODONE-ACETAMINOPHEN 5-325 MG PO TABS: 1.0000 | ORAL_TABLET | Freq: Four times a day (QID) | ORAL | Status: DC | PRN

## 2014-06-21 MED ORDER — HYDROCODONE-ACETAMINOPHEN 5-325 MG PO TABS
1.0000 | ORAL_TABLET | Freq: Once | ORAL | Status: AC
Start: 2014-06-21 — End: 2014-06-21
  Administered 2014-06-21: 1 via ORAL
  Filled 2014-06-21: qty 1

## 2014-06-21 NOTE — Discharge Instructions (Signed)

## 2014-06-21 NOTE — MAU Note (Signed)
Was going up steps and missed last step and fell forward. Fell straight on my face and hit abd. Pain L side and back. Nauseated. Had to pee and leaked fld when fell but none since. ? Urine. Denies bleeding

## 2014-06-21 NOTE — MAU Note (Signed)
Pt says she is able to breath without difficulty at this time. O2 sat 100% on RA

## 2014-06-21 NOTE — MAU Provider Note (Signed)
History     CSN: [id number]  Arrival date and time: 06/21/14 6160   First Provider Initiated Contact with Patient 06/21/14 2056      Chief Complaint  Patient presents with  . Fall  . Back Pain  . Nausea   HPI Tracie Garcia Garcia 30 y.o. [id number] @[redacted]w[redacted]d  presents to MAU after a fall.  She was going up 2 small steps into the house and she missed the last step, falling forward.  She caught herself with her hands but hit her stomach as well.  This happened around 7:30 this evening.  It did not hurt at first but pain has gotten worse over the last hour.  She noted that her underwear was wet when she got up.  She's having a piercing pain from her back to her stomach.  She is having nausea and vomiting and a tight feeling in her stomach.  She feels it is hard to breathe.    She denies vaginal bleeding, dysuria.  She has felt fetal movements in this pregnancy but none since the fall.   OB History    Gravida Para Term Preterm AB TAB SAB Ectopic Multiple Living   6 2 2  0 3 3 0 0 0 2      Past Medical History  Diagnosis Date  . Abnormal Pap smear   . Gonorrhea   . Chlamydia   . Headache     Past Surgical History  Procedure Laterality Date  . Colposcopy    . Dilation and curettage of uterus      tab x2  . Therapeutic abortion      Family History  Problem Relation Age of Onset  . Adopted: Yes  . Alcohol abuse Neg Hx     History  Substance Use Topics  . Smoking status: Former Smoker -- 0.25 packs/day    Types: Cigarettes  . Smokeless tobacco: Never Used  . Alcohol Use: No    Allergies:  Allergies  Allergen Reactions  . Other Swelling    Monastat    Prescriptions prior to admission  Medication Sig Dispense Refill Last Dose  . acetaminophen (TYLENOL) 500 MG tablet Take 1,000 mg by mouth every 6 (six) hours as needed.   Taking  . buPROPion (WELLBUTRIN SR) 150 MG 12 hr tablet Take 1 tablet po qam for 7 days, then increase to 1 po bid.  Take last dose no later than 5pm. 30  tablet 11 Taking  . butalbital-acetaminophen-caffeine (FIORICET) 50-325-40 MG per tablet Take 2 tablets by mouth every 6 (six) hours as needed for headache or migraine. 40 tablet 2   . Doxylamine-Pyridoxine (DICLEGIS) 10-10 MG TBEC Take 1 tablet by mouth 3 (three) times daily. 1 tab in AM & Lunch.  Increase to 2 tablets at bedtime. (Patient not taking: Reported on 03/26/2014) 100 tablet 4 Not Taking  . levothyroxine (SYNTHROID, LEVOTHROID) 75 MCG tablet Take 75 mcg by mouth daily.    Taking  . metoCLOPramide (REGLAN) 10 MG tablet Take 1 tablet (10 mg total) by mouth 3 (three) times daily before meals. (Patient not taking: Reported on 04/09/2014) 90 tablet 1 Not Taking  . metroNIDAZOLE (FLAGYL) 500 MG tablet Take 1 tablet (500 mg total) by mouth 2 (two) times daily. (Patient not taking: Reported on 06/04/2014) 14 tablet 0 Not Taking  . Prenat-FeCbn-FeAspGl-FA-Omega (OB COMPLETE PETITE) 35-5-1-200 MG CAPS Take 1 capsule by mouth daily. 30 capsule 11 Taking  . promethazine (PHENERGAN) 25 MG tablet Take 1 tablet (25 mg total)  by mouth every 6 (six) hours as needed for nausea or vomiting. (Patient not taking: Reported on 03/26/2014) 30 tablet 0 Not Taking  . terconazole (TERAZOL 7) 0.4 % vaginal cream Place 1 applicator vaginally at bedtime. 45 g 0     ROS Pertinent ROS in HPI.  All other systems are negative.   Physical Exam   Blood pressure 113/67, pulse 77, temperature 98.2 F (36.8 C), resp. rate 18, height  (1.499 m), weight 140 lb 12.8 oz (63.866 kg), last menstrual period 02/11/2014.  Physical Exam  Constitutional: She is oriented to person, place, and time. She appears well-developed and well-nourished. No distress.  HENT:  Head: Normocephalic and atraumatic.  Eyes: EOM are normal.  Neck: Normal range of motion.  Cardiovascular: Normal rate.   Respiratory: Effort normal and breath sounds normal.  GI: Soft. There is tenderness.  Tenderness across left side of abdomen  Musculoskeletal:  Normal range of motion.  Neurological: She is alert and oriented to person, place, and time.  Skin: Skin is warm and dry.  Psychiatric: She has a normal mood and affect.   Fetal heart tones normal on triage MAU Course  Procedures  MDM U/S ordered to confirm no abruption/good fluid, etc.  U/A is negative Prelim U/S report is negative.  Norco 5/325 ordered to help pt be more comfortable.  Results explained to pt.  She is requesting discharge.  She states nausea is improved already.    Assessment and Plan  A:  1. Traumatic injury during pregnancy in second trimester    P: Discharge to home Reglan rx for nausea Norco for pain relief - or can use OTC Tylenol - do not use both together Return to MAU for increase in pain, vaginal bleeding, uncontrolled vomiting, etc.   Follow up in clinic with Dr. Clearance Coots next week.  Bertram Denver 06/21/2014, 8:56 PM

## 2014-06-24 ENCOUNTER — Other Ambulatory Visit: Payer: Self-pay | Admitting: Obstetrics

## 2014-06-24 DIAGNOSIS — B373 Candidiasis of vulva and vagina: Secondary | ICD-10-CM

## 2014-06-24 DIAGNOSIS — B3731 Acute candidiasis of vulva and vagina: Secondary | ICD-10-CM

## 2014-06-24 MED ORDER — FLUCONAZOLE 150 MG PO TABS: 150.0000 mg | ORAL_TABLET | Freq: Once | ORAL | Status: DC

## 2014-06-27 ENCOUNTER — Ambulatory Visit (INDEPENDENT_AMBULATORY_CARE_PROVIDER_SITE_OTHER): Payer: Medicaid Other

## 2014-06-27 ENCOUNTER — Encounter: Payer: Self-pay | Admitting: Obstetrics

## 2014-06-27 ENCOUNTER — Ambulatory Visit (INDEPENDENT_AMBULATORY_CARE_PROVIDER_SITE_OTHER): Payer: Medicaid Other | Admitting: Obstetrics

## 2014-06-27 VITALS — BP 113/78 | HR 75 | Temp 97.9°F | Wt 139.0 lb

## 2014-06-27 DIAGNOSIS — Z3482 Encounter for supervision of other normal pregnancy, second trimester: Secondary | ICD-10-CM

## 2014-06-27 LAB — POCT URINALYSIS DIPSTICK
BILIRUBIN UA: NEGATIVE
KETONES UA: NEGATIVE
Nitrite, UA: NEGATIVE
RBC UA: NEGATIVE
Spec Grav, UA: 1.01
UROBILINOGEN UA: NEGATIVE
pH, UA: 6

## 2014-06-27 LAB — US OB COMP + 14 WK

## 2014-06-27 NOTE — Progress Notes (Signed)
Subjective:    Tracie Garcia is a 29 y.o. female being seen today for her obstetrical visit. She is at [redacted]w[redacted]d gestation. Patient reports: no complaints . Fetal movement: normal.  Problem List Items Addressed This Visit    None    Visit Diagnoses    Supervision of other normal pregnancy, antepartum, first trimester    -  Primary    Relevant Orders    POCT urinalysis dipstick (Completed)      Patient Active Problem List   Diagnosis Date Noted  . Hyperemesis gravidarum before end of [redacted] week gestation, dehydration 03/26/2014  . Thrombosed external hemorrhoids 07/20/2012   Objective:    BP 113/78 mmHg  Pulse 75  Temp(Src) 97.9 F (36.6 C)  Wt 139 lb (63.05 kg)  LMP 02/11/2014 FHT: 150 BPM  Uterine Size: size equals dates     Assessment:    Pregnancy @ [redacted]w[redacted]d    Plan:    OBGCT: discussed.  Labs, problem list reviewed and updated 2 hr GTT planned Follow up in 4 weeks.

## 2014-07-09 ENCOUNTER — Other Ambulatory Visit: Payer: Self-pay | Admitting: Certified Nurse Midwife

## 2014-07-09 ENCOUNTER — Other Ambulatory Visit: Payer: Medicaid Other

## 2014-07-25 ENCOUNTER — Encounter: Payer: Self-pay | Admitting: Obstetrics

## 2014-07-25 ENCOUNTER — Ambulatory Visit (INDEPENDENT_AMBULATORY_CARE_PROVIDER_SITE_OTHER): Payer: Medicaid Other | Admitting: Obstetrics

## 2014-07-25 VITALS — BP 104/71 | HR 86 | Temp 98.2°F | Wt 144.0 lb

## 2014-07-25 DIAGNOSIS — O99012 Anemia complicating pregnancy, second trimester: Secondary | ICD-10-CM

## 2014-07-25 DIAGNOSIS — Z3482 Encounter for supervision of other normal pregnancy, second trimester: Secondary | ICD-10-CM

## 2014-07-25 MED ORDER — FUSION PLUS PO CAPS: 1.0000 | ORAL_CAPSULE | Freq: Every day | ORAL | Status: DC

## 2014-07-25 NOTE — Progress Notes (Signed)
Subjective:    Tracie Garcia is a 29 y.o. female being seen today for her obstetrical visit. She is at [redacted]w[redacted]d gestation. Patient reports: no complaints . Fetal movement: normal.  Problem List Items Addressed This Visit    None    Visit Diagnoses    Anemia affecting pregnancy in second trimester, antepartum    -  Primary    Relevant Medications    Iron-FA-B Cmp-C-Biot-Probiotic (FUSION PLUS) CAPS      Patient Active Problem List   Diagnosis Date Noted  . Hyperemesis gravidarum before end of [redacted] week gestation, dehydration 03/26/2014  . Thrombosed external hemorrhoids 07/20/2012   Objective:    BP 104/71 mmHg  Pulse 86  Temp(Src) 98.2 F (36.8 C)  Wt 144 lb (65.318 kg)  LMP 02/11/2014 FHT: 150 BPM  Uterine Size: size equals dates     Assessment:    Pregnancy @ [redacted]w[redacted]d    Plan:    OBGCT: ordered for next visit.  Labs, problem list reviewed and updated 2 hr GTT planned Follow up in 4 weeks.

## 2014-08-04 ENCOUNTER — Inpatient Hospital Stay (HOSPITAL_COMMUNITY)
Admission: AD | Admit: 2014-08-04 | Discharge: 2014-08-04 | Disposition: A | Discharge status: Home / Self Care | Payer: Medicaid Other | Source: Ambulatory Visit | Attending: Obstetrics | Admitting: Obstetrics

## 2014-08-04 ENCOUNTER — Encounter (HOSPITAL_COMMUNITY): Payer: Self-pay | Admitting: *Deleted

## 2014-08-04 DIAGNOSIS — O2242 Hemorrhoids in pregnancy, second trimester: Secondary | ICD-10-CM | POA: Diagnosis not present

## 2014-08-04 DIAGNOSIS — Z87891 Personal history of nicotine dependence: Secondary | ICD-10-CM | POA: Diagnosis not present

## 2014-08-04 DIAGNOSIS — K649 Unspecified hemorrhoids: Secondary | ICD-10-CM | POA: Diagnosis not present

## 2014-08-04 DIAGNOSIS — K644 Residual hemorrhoidal skin tags: Secondary | ICD-10-CM | POA: Diagnosis not present

## 2014-08-04 DIAGNOSIS — Z3A24 24 weeks gestation of pregnancy: Secondary | ICD-10-CM | POA: Insufficient documentation

## 2014-08-04 MED ORDER — OXYCODONE-ACETAMINOPHEN 5-325 MG PO TABS: 1.0000 | ORAL_TABLET | Freq: Four times a day (QID) | ORAL | Status: DC | PRN

## 2014-08-04 MED ORDER — HYDROCORTISONE ACE-PRAMOXINE 1-1 % RE FOAM: 1.0000 | Freq: Two times a day (BID) | RECTAL | Status: DC

## 2014-08-04 MED ORDER — DOCUSATE SODIUM 250 MG PO CAPS: 250.0000 mg | ORAL_CAPSULE | Freq: Every day | ORAL | Status: DC

## 2014-08-04 NOTE — MAU Provider Note (Signed)
History     CSN: [id number]  Arrival date and time: 08/04/14 1191   First Provider Initiated Contact with Patient 08/04/14 1906      Chief Complaint  Patient presents with  . Hemorrhoids   HPI Ms. Tracie Garcia is a 29 y.o. [id number] at [redacted]w[redacted]d who presents to MAU today with complaint of hemorrhoids. The patient states that she has had issues with thrombosed hemorrhoids requiring surgical removal in the past. She states that she has noted pain and swelling with current hemorrhoids x 2 days. She has tried creams and tucks pads to the area without relief. She denies abdomina pain, vaginal bleeding, LOF or contractions. She reports normal fetal movement.    OB History    Gravida Para Term Preterm AB TAB SAB Ectopic Multiple Living   0 3 3 0 0 0 2      Past Medical History  Diagnosis Date  . Abnormal Pap smear   . Gonorrhea   . Chlamydia   . Headache     Past Surgical History  Procedure Laterality Date  . Colposcopy    . Dilation and curettage of uterus      tab x2  . Therapeutic abortion      Family History  Problem Relation Age of Onset  . Adopted: Yes  . Alcohol abuse Neg Hx     History  Substance Use Topics  . Smoking status: Former Smoker -- 0.25 packs/day    Types: Cigarettes  . Smokeless tobacco: Never Used  . Alcohol Use: No    Allergies:  Allergies  Allergen Reactions  . Monistat [Miconazole] Swelling    Prescriptions prior to admission  Medication Sig Dispense Refill Last Dose  . acetaminophen (TYLENOL) 500 MG tablet Take 1,000 mg by mouth every 6 (six) hours as needed for mild pain or headache.    08/04/2014 at 0830  . butalbital-acetaminophen-caffeine (FIORICET) 50-325-40 MG per tablet Take 2 tablets by mouth every 6 (six) hours as needed for headache or migraine. (Patient taking differently: Take 2 tablets by mouth every 6 (six) hours as needed for headache or migraine. ) 40 tablet 2 PRN at PRN  . levothyroxine (SYNTHROID, LEVOTHROID) 75  MCG tablet Take 75 mcg by mouth daily.    08/04/2014 at Unknown time  . Prenat-FeCbn-FeAspGl-FA-Omega (OB COMPLETE PETITE) 35-5-1-200 MG CAPS Take 1 capsule by mouth daily. 30 capsule 11 08/03/2014 at Unknown time  . fluconazole (DIFLUCAN) 150 MG tablet Take 1 tablet (150 mg total) by mouth once. (Patient not taking: Reported on 06/27/2014) 1 tablet 2 Not Taking  . HYDROcodone-acetaminophen (NORCO/VICODIN) 5-325 MG per tablet Take 1-2 tablets by mouth every 6 (six) hours as needed for moderate pain. (Patient not taking: Reported on 06/27/2014) 10 tablet 0 Not Taking  . Iron-FA-B Cmp-C-Biot-Probiotic (FUSION PLUS) CAPS Take 1 capsule by mouth daily before breakfast. (Patient not taking: Reported on 08/04/2014) 90 capsule 3   . metoCLOPramide (REGLAN) 10 MG tablet Take 1 tablet (10 mg total) by mouth 3 (three) times daily as needed for nausea. (Patient not taking: Reported on 06/27/2014) 20 tablet 0 Not Taking    Review of Systems  Gastrointestinal: Negative for abdominal pain.       + hemorrhoids  Genitourinary:       Neg - vaginal bleeding, discharge LOF   Physical Exam   Blood pressure 92/52, pulse 96, temperature 97.5 F (36.4 C), temperature source Oral, resp. rate 20, height  (1.499 m), weight 144 lb (  65.318 kg), last menstrual period 02/11/2014.  Physical Exam  Nursing note and vitals reviewed. Constitutional: She is oriented to person, place, and time. She appears well-developed and well-nourished. No distress.  HENT:  Head: Normocephalic and atraumatic.  Cardiovascular: Normal rate.   Respiratory: Effort normal.  GI: Soft.  Genitourinary: Rectal exam shows external hemorrhoid (multiple large hemorrhoids noted, tenderness to palpation, no active bleeding).  Neurological: She is alert and oriented to person, place, and time.  Skin: Skin is warm and dry. No erythema.  Psychiatric: She has a normal mood and affect.   Fetal Monitoring: Baseline: 135 bpm, minimal variability, + 10 x  10 accelerations, no decelerations Contractions: none Reassuring for gestational age MAU Course  Procedures None   Assessment and Plan  A: SIUP at [redacted]w[redacted]d External hemorrhoids, multiple  P: Discharge home Rx for Percocet, Colace and Proctofoam HC given to patient Warning signs for worsening condition discussed Advised to increased PO hydration and dietary fiber intake Patient advised to follow-up with Dr. Verdell Carmine office in the morning for further management and possible referral to general surgery Patient may return to MAU as needed or if her condition were to change or worsen  Marny Lowenstein, PA-C  08/04/2014, 7:26 PM

## 2014-08-04 NOTE — MAU Note (Signed)
Patient presents at [redacted] weeks gestation with c/o hemorrhoidal pain X 2 days unrelieved by creams or Tuck pads. Fetus active. Denies bleeding or discharge.

## 2014-08-04 NOTE — Discharge Instructions (Signed)
Hemorrhoids Hemorrhoids are puffy (swollen) veins around the rectum or anus. Hemorrhoids can cause pain, itching, bleeding, or irritation. HOME CARE 1. Eat foods with fiber, such as whole grains, beans, nuts, fruits, and vegetables. Ask your doctor about taking products with added fiber in them (fibersupplements). 2. Drink enough fluid to keep your pee (urine) clear or pale yellow. 3. Exercise often. 4. Go to the bathroom when you have the urge to poop. Do not wait. 5. Avoid straining to poop (bowel movement). 6. Keep the butt area dry and clean. Use wet toilet paper or moist paper towels. 7. Medicated creams and medicine inserted into the anus (anal suppository) may be used or applied as told. 8. Only take medicine as told by your doctor. 9. Take a warm water bath (sitz bath) for 15-20 minutes to ease pain. Do this 3-4 times a day. 10. Place ice packs on the area if it is tender or puffy. Use the ice packs between the warm water baths. 1. Put ice in a plastic bag. 2. Place a towel between your skin and the bag. 3. Leave the ice on for 15-20 minutes, 03-04 times a day. 11. Do not use a donut-shaped pillow or sit on the toilet for a long time. GET HELP RIGHT AWAY IF:   You have more pain that is not controlled by treatment or medicine.  You have bleeding that will not stop.  You have trouble or are unable to poop (bowel movement).  You have pain or puffiness outside the area of the hemorrhoids. MAKE SURE YOU:   Understand these instructions.  Will watch your condition.  Will get help right away if you are not doing well or get worse. Document Released: 09/30/2007 Document Revised: 12/08/2011 Document Reviewed: 11/02/2011 [facility] Patient Information 2015 [place], [place]. This information is not intended to replace advice given to you by your health care provider. Make sure you discuss any questions you have with your health care provider. Sitz Bath A sitz bath is a warm water  bath taken in the sitting position. The water covers only the hips and butt (buttocks). It may be used for either healing or cleaning purposes. Sitz baths are also used to relieve pain, itching, or muscle tightening (spasms). The water may contain medicine. Moist heat will help you heal and relax.  HOME CARE  Take 3 to 4 sitz baths a day. 12. Fill the bathtub half-full with warm water. 13. Sit in the water and open the drain a little. 14. Turn on the warm water to keep the tub half-full. Keep the water running constantly. 15. Soak in the water for 15 to 20 minutes. 16. After the sitz bath, pat the affected area dry. GET HELP RIGHT AWAY IF: You get worse instead of better. Stop the sitz baths if you get worse. MAKE SURE YOU:  Understand these instructions.  Will watch your condition.  Will get help right away if you are not doing well or get worse. Document Released: 01/29/2004 Document Revised: 09/15/2011 Document Reviewed: 04/20/2010 [facility] Patient Information 2015 [place], [place]. This information is not intended to replace advice given to you by your health care provider. Make sure you discuss any questions you have with your health care provider.

## 2014-08-06 ENCOUNTER — Emergency Department (HOSPITAL_COMMUNITY)
Admission: EM | Admit: 2014-08-06 | Discharge: 2014-08-07 | Disposition: A | Discharge status: Home / Self Care | Payer: Medicaid Other | Attending: Emergency Medicine | Admitting: Emergency Medicine

## 2014-08-06 ENCOUNTER — Telehealth: Payer: Self-pay | Admitting: *Deleted

## 2014-08-06 ENCOUNTER — Encounter (HOSPITAL_COMMUNITY): Payer: Self-pay | Admitting: Emergency Medicine

## 2014-08-06 ENCOUNTER — Other Ambulatory Visit: Payer: Self-pay | Admitting: Obstetrics

## 2014-08-06 DIAGNOSIS — Z3A25 25 weeks gestation of pregnancy: Secondary | ICD-10-CM | POA: Diagnosis not present

## 2014-08-06 DIAGNOSIS — Z8619 Personal history of other infectious and parasitic diseases: Secondary | ICD-10-CM | POA: Insufficient documentation

## 2014-08-06 DIAGNOSIS — O99612 Diseases of the digestive system complicating pregnancy, second trimester: Secondary | ICD-10-CM

## 2014-08-06 DIAGNOSIS — K641 Second degree hemorrhoids: Secondary | ICD-10-CM

## 2014-08-06 DIAGNOSIS — Z79899 Other long term (current) drug therapy: Secondary | ICD-10-CM | POA: Diagnosis not present

## 2014-08-06 DIAGNOSIS — K5900 Constipation, unspecified: Secondary | ICD-10-CM

## 2014-08-06 DIAGNOSIS — K649 Unspecified hemorrhoids: Secondary | ICD-10-CM

## 2014-08-06 DIAGNOSIS — O2242 Hemorrhoids in pregnancy, second trimester: Secondary | ICD-10-CM | POA: Insufficient documentation

## 2014-08-06 DIAGNOSIS — Z87891 Personal history of nicotine dependence: Secondary | ICD-10-CM | POA: Diagnosis not present

## 2014-08-06 MED ORDER — HYDROMORPHONE HCL 1 MG/ML IJ SOLN
1.0000 mg | Freq: Once | INTRAMUSCULAR | Status: AC
  Administered 2014-08-06: 1 mg via INTRAMUSCULAR
  Filled 2014-08-06: qty 1

## 2014-08-06 MED ORDER — HYDROCORTISONE ACETATE 25 MG RE SUPP: 25.0000 mg | Freq: Two times a day (BID) | RECTAL | Status: DC

## 2014-08-06 MED ORDER — DOCUSATE SODIUM 100 MG PO CAPS: 100.0000 mg | ORAL_CAPSULE | Freq: Two times a day (BID) | ORAL | Status: DC

## 2014-08-06 NOTE — Telephone Encounter (Signed)
Patient states she went to see the surgeon today and they are not going to do anything until after delivery. She is miserable and wants to know if Dr Clearance Coots can help her. Told patient I would let him know and have him contact her.

## 2014-08-06 NOTE — Telephone Encounter (Signed)
Called patient and discussed her condition and recommendations for comfort until delivery. Rxs for Anusol HC suppositories and Colace e-scribed.

## 2014-08-06 NOTE — ED Notes (Signed)
Pt states she has been treating her hemorrhoids at home for the past few days  Pt states she was seen at [facility] a couple days ago and was given medication to help with the pain  Pt states the pain is worse and she is in pain  Pt is [redacted] weeks pregnant

## 2014-08-06 NOTE — ED Provider Notes (Signed)
CSN: [id number]     Arrival date & time 08/06/14  2237 History   First MD Initiated Contact with Patient 08/06/14 2250     Chief Complaint  Patient presents with  . Hemorrhoids     (Consider location/radiation/quality/duration/timing/severity/associated sxs/prior Treatment) HPI Tracie Garcia is a 29 y.o. female G11P2, currently [redacted]wks pregnant, presents to ED with complaint of rectal pain. Pt with hx of hemorrhoids, states flared up several days ago. History of hemorrhoid procedure in the past, she is unsure what exactly they did. Patient states she is in severe pain. She was seen by a general surgeon who told her that they are unable to do anything until she delivers her child. She was seen at [facility] and by her GYN doctor and currently is being treated with topical lidocaine, hydrocortisone cream, Percocet for pain. Patient is doing sitz bath every few hours. She states she just doesn't know what to do anymore. Patient states her pain is 15 out of 10. Denies any other pregnancy related complaints  Past Medical History  Diagnosis Date  . Abnormal Pap smear   . Gonorrhea   . Chlamydia   . Headache    Past Surgical History  Procedure Laterality Date  . Colposcopy    . Dilation and curettage of uterus      tab x2  . Therapeutic abortion     Family History  Problem Relation Age of Onset  . Adopted: Yes  . Alcohol abuse Neg Hx    History  Substance Use Topics  . Smoking status: Former Smoker -- 0.25 packs/day    Types: Cigarettes  . Smokeless tobacco: Never Used  . Alcohol Use: No   OB History    Gravida Para Term Preterm AB TAB SAB Ectopic Multiple Living   6 2 2  0 3 3 0 0 0 2     Review of Systems  Constitutional: Negative for fever and chills.  Respiratory: Negative for cough, chest tightness and shortness of breath.   Cardiovascular: Negative for chest pain, palpitations and leg swelling.  Gastrointestinal: Positive for rectal pain. Negative for nausea,  vomiting, abdominal pain, diarrhea and anal bleeding.  Genitourinary: Negative for dysuria, flank pain, vaginal bleeding, vaginal discharge, vaginal pain and pelvic pain.  Musculoskeletal: Negative for myalgias.  Skin: Negative for rash.  Neurological: Negative for dizziness, weakness and headaches.  All other systems reviewed and are negative.     Allergies  Monistat  Home Medications   Prior to Admission medications   Medication Sig Start Date End Date Taking? Authorizing Provider  docusate sodium (COLACE) 100 MG capsule Take 1 capsule (100 mg total) by mouth 2 (two) times daily. 08/06/14  Yes Brock Bad, MD  docusate sodium (COLACE) 250 MG capsule Take 1 capsule (250 mg total) by mouth daily. 08/04/14  Yes Marny Lowenstein, PA-C  hydrocortisone-pramoxine (PROCTOFOAM [facility]) rectal foam Place 1 applicator rectally 2 (two) times daily. 08/04/14  Yes Marny Lowenstein, PA-C  levothyroxine (SYNTHROID, LEVOTHROID) 75 MCG tablet Take 75 mcg by mouth daily.    Yes Historical Provider, MD  oxyCODONE-acetaminophen (PERCOCET/ROXICET) 5-325 MG per tablet Take 1 tablet by mouth every 6 (six) hours as needed for severe pain. 08/04/14  Yes Marny Lowenstein, PA-C  Prenat-FeCbn-FeAspGl-FA-Omega (OB COMPLETE PETITE) 35-5-1-200 MG CAPS Take 1 capsule by mouth daily. 04/15/14  Yes Brock Bad, MD  hydrocortisone (ANUSOL-HC) 25 MG suppository Place 1 suppository (25 mg total) rectally 2 (two) times daily. 08/06/14   Leonette Most  Julio Alm, MD  Iron-FA-B Cmp-C-Biot-Probiotic (FUSION PLUS) CAPS Take 1 capsule by mouth daily before breakfast. Patient not taking: Reported on 08/04/2014 07/25/14   Brock Bad, MD   BP 119/69 mmHg  Pulse 98  Temp(Src) 98.3 F (36.8 C) (Oral)  Resp 26  Wt 145 lb (65.772 kg)  SpO2 100%  LMP 02/11/2014 Physical Exam  Constitutional: She appears well-developed and well-nourished. No distress.  Eyes: Conjunctivae are normal.  Neck: Neck supple.  Cardiovascular: Normal rate, regular  rhythm and normal heart sounds.   Pulmonary/Chest: Effort normal and breath sounds normal. No respiratory distress. She has no wheezes. She has no rales.  Abdominal: Soft.  gravid  Genitourinary:  Multiple Large edematous hemorrhoids to the rectum. No bleeding. Tender to palpation.  Neurological: She is alert.  Skin: Skin is warm and dry.  Nursing note and vitals reviewed.   ED Course  Procedures (including critical care time) Labs Review Labs Reviewed - No data to display  Imaging Review No results found.   EKG Interpretation None      MDM   Final diagnoses:  Hemorrhoids, unspecified hemorrhoid type     patient with severe rectal pain from hemorrhoids. History of the same. She has already seen her OB/GYN, was seen at [facility] recently, and so a Careers adviser. She is currently using Preparation H, topical lidocaine cream, Anusol. All with no relief. She is taking Percocet which she states "does not do anything." I will treat her pain with IM Dilaudid emergency department. Sugar applied topically for swelling.   Filed Vitals:   08/06/14 2241  BP: 119/69  Pulse: 98  Temp: 98.3 F (36.8 C)  TempSrc: Oral  Resp: 26  Weight: 145 lb (65.772 kg)  SpO2: 100%   12:11 AM Feels much better. D/c home. Will give more percocet. Explained it is not recommended in pregnancy and baby side effects. Pt understands. Follow up with general surgery.   Filed Vitals:   08/06/14 2241  BP: 119/69  Pulse: 98  Temp: 98.3 F (36.8 C)  TempSrc: Oral  Resp: 26  Weight: 145 lb (65.772 kg)  SpO2: 100%     Jaynie Crumble, PA-C 08/07/14 0012  Melene Plan, DO 08/07/14 0025

## 2014-08-07 MED ORDER — OXYCODONE-ACETAMINOPHEN 5-325 MG PO TABS: 1.0000 | ORAL_TABLET | ORAL | Status: DC | PRN

## 2014-08-07 NOTE — Discharge Instructions (Signed)
Take percocet for pain as prescribed as needed. Continue all current medications. Follow up with general surgery.   Hemorrhoids Hemorrhoids are swollen veins around the rectum or anus. There are two types of hemorrhoids:   Internal hemorrhoids. These occur in the veins just inside the rectum. They may poke through to the outside and become irritated and painful.  External hemorrhoids. These occur in the veins outside the anus and can be felt as a painful swelling or hard lump near the anus. CAUSES  Pregnancy.   Obesity.   Constipation or diarrhea.   Straining to have a bowel movement.   Sitting for long periods on the toilet.  Heavy lifting or other activity that caused you to strain.  Anal intercourse. SYMPTOMS   Pain.   Anal itching or irritation.   Rectal bleeding.   Fecal leakage.   Anal swelling.   One or more lumps around the anus.  DIAGNOSIS  Your caregiver may be able to diagnose hemorrhoids by visual examination. Other examinations or tests that may be performed include:   Examination of the rectal area with a gloved hand (digital rectal exam).   Examination of anal canal using a small tube (scope).   A blood test if you have lost a significant amount of blood.  A test to look inside the colon (sigmoidoscopy or colonoscopy). TREATMENT Most hemorrhoids can be treated at home. However, if symptoms do not seem to be getting better or if you have a lot of rectal bleeding, your caregiver may perform a procedure to help make the hemorrhoids get smaller or remove them completely. Possible treatments include:   Placing a rubber band at the base of the hemorrhoid to cut off the circulation (rubber band ligation).   Injecting a chemical to shrink the hemorrhoid (sclerotherapy).   Using a tool to burn the hemorrhoid (infrared light therapy).   Surgically removing the hemorrhoid (hemorrhoidectomy).   Stapling the hemorrhoid to block blood flow to  the tissue (hemorrhoid stapling).  HOME CARE INSTRUCTIONS   Eat foods with fiber, such as whole grains, beans, nuts, fruits, and vegetables. Ask your doctor about taking products with added fiber in them (fibersupplements).  Increase fluid intake. Drink enough water and fluids to keep your urine clear or pale yellow.   Exercise regularly.   Go to the bathroom when you have the urge to have a bowel movement. Do not wait.   Avoid straining to have bowel movements.   Keep the anal area dry and clean. Use wet toilet paper or moist towelettes after a bowel movement.   Medicated creams and suppositories may be used or applied as directed.   Only take over-the-counter or prescription medicines as directed by your caregiver.   Take warm sitz baths for 15-20 minutes, 3-4 times a day to ease pain and discomfort.   Place ice packs on the hemorrhoids if they are tender and swollen. Using ice packs between sitz baths may be helpful.   Put ice in a plastic bag.   Place a towel between your skin and the bag.   Leave the ice on for 15-20 minutes, 3-4 times a day.   Do not use a donut-shaped pillow or sit on the toilet for long periods. This increases blood pooling and pain.  SEEK MEDICAL CARE IF:  You have increasing pain and swelling that is not controlled by treatment or medicine.  You have uncontrolled bleeding.  You have difficulty or you are unable to have a bowel movement.  You have pain or inflammation outside the area of the hemorrhoids. MAKE SURE YOU:  Understand these instructions.  Will watch your condition.  Will get help right away if you are not doing well or get worse. Document Released: 12/19/1999 Document Revised: 12/08/2011 Document Reviewed: 10/26/2011 [facility] Patient Information 2015 [place], [place]. This information is not intended to replace advice given to you by your health care provider. Make sure you discuss any questions you have with your  health care provider.

## 2014-08-09 ENCOUNTER — Telehealth: Payer: Self-pay | Admitting: *Deleted

## 2014-08-09 NOTE — Telephone Encounter (Signed)
Patient states she is still having trouble with her hemorrhoids. Patient states she tried to have a bowel movement this morning and saw nothing but blood. Per Dr. Clearance Coots, patient advised to be evaluated at [facility] Emergency room. Patient verbalized understanding.

## 2014-08-22 ENCOUNTER — Ambulatory Visit (INDEPENDENT_AMBULATORY_CARE_PROVIDER_SITE_OTHER): Payer: Medicaid Other | Admitting: Obstetrics

## 2014-08-22 VITALS — BP 110/70 | HR 93 | Temp 98.3°F | Wt 147.1 lb

## 2014-08-22 DIAGNOSIS — Z3689 Encounter for other specified antenatal screening: Secondary | ICD-10-CM

## 2014-08-22 DIAGNOSIS — Z3483 Encounter for supervision of other normal pregnancy, third trimester: Secondary | ICD-10-CM

## 2014-08-22 DIAGNOSIS — Z36 Encounter for antenatal screening of mother: Secondary | ICD-10-CM

## 2014-08-22 LAB — POCT URINALYSIS DIPSTICK
Bilirubin, UA: NEGATIVE
Blood, UA: NEGATIVE
Glucose, UA: NORMAL
Ketones, UA: NEGATIVE
LEUKOCYTES UA: NEGATIVE
Nitrite, UA: NEGATIVE
PH UA: 6.5
Spec Grav, UA: 1.01
UROBILINOGEN UA: NEGATIVE

## 2014-08-23 ENCOUNTER — Encounter: Payer: Self-pay | Admitting: Obstetrics

## 2014-08-23 NOTE — Progress Notes (Signed)
Subjective:    Tracie Garcia is a 29 y.o. female being seen today for her obstetrical visit. She is at [redacted]w[redacted]d gestation. Patient reports: no complaints . Fetal movement: normal.  Problem List Items Addressed This Visit    None    Visit Diagnoses    Encounter for supervision of other normal pregnancy in second trimester    -  Primary    Relevant Orders    POCT urinalysis dipstick (Completed)    SureSwab, Vaginosis/Vaginitis Plus    Ultrasound scan to check interval growth of fetus        Relevant Orders    US OB Comp + 14 Wk    US OB Follow Up    Encounter for fetal anatomic survey        Relevant Orders    US OB Comp + 14 Wk    US OB Follow Up      Patient Active Problem List   Diagnosis Date Noted  . Hyperemesis gravidarum before end of [redacted] week gestation, dehydration 03/26/2014  . Thrombosed external hemorrhoids 07/20/2012   Objective:    BP 110/70 mmHg  Pulse 93  Temp(Src) 98.3 F (36.8 C)  Wt 147 lb 1.6 oz (66.724 kg)  LMP 02/11/2014 FHT: 150 BPM  Uterine Size: size equals dates     Assessment:    Pregnancy @ 105w4d    Plan:    OBGCT: ordered.  Labs, problem list reviewed and updated 2 hr GTT planned Follow up in 2 weeks.

## 2014-08-27 ENCOUNTER — Other Ambulatory Visit: Payer: Self-pay | Admitting: Obstetrics

## 2014-08-27 DIAGNOSIS — N760 Acute vaginitis: Principal | ICD-10-CM

## 2014-08-27 DIAGNOSIS — B9689 Other specified bacterial agents as the cause of diseases classified elsewhere: Secondary | ICD-10-CM

## 2014-08-27 LAB — SURESWAB, VAGINOSIS/VAGINITIS PLUS
Atopobium vaginae: 7 Log (cells/mL)
C. ALBICANS, DNA: NOT DETECTED
C. PARAPSILOSIS, DNA: NOT DETECTED
C. TROPICALIS, DNA: NOT DETECTED
C. glabrata, DNA: NOT DETECTED
C. trachomatis RNA, TMA: NOT DETECTED
LACTOBACILLUS SPECIES: NOT DETECTED Log (cells/mL)
MEGASPHAERA SPECIES: >8 Log (cells/mL)
N. gonorrhoeae RNA, TMA: NOT DETECTED
T. vaginalis RNA, QL TMA: NOT DETECTED

## 2014-08-27 MED ORDER — TINIDAZOLE 500 MG PO TABS: 1000.0000 mg | ORAL_TABLET | Freq: Every day | ORAL | Status: DC

## 2014-08-29 ENCOUNTER — Ambulatory Visit (HOSPITAL_COMMUNITY)
Admission: RE | Admit: 2014-08-29 | Discharge: 2014-08-29 | Disposition: A | Discharge status: Home / Self Care | Payer: Medicaid Other | Source: Ambulatory Visit | Attending: Obstetrics | Admitting: Obstetrics

## 2014-08-29 DIAGNOSIS — Z36 Encounter for antenatal screening of mother: Secondary | ICD-10-CM | POA: Diagnosis not present

## 2014-08-29 DIAGNOSIS — Z3689 Encounter for other specified antenatal screening: Secondary | ICD-10-CM

## 2014-09-05 ENCOUNTER — Encounter: Payer: Self-pay | Admitting: Obstetrics

## 2014-09-05 ENCOUNTER — Ambulatory Visit (INDEPENDENT_AMBULATORY_CARE_PROVIDER_SITE_OTHER): Payer: Medicaid Other | Admitting: Obstetrics

## 2014-09-05 ENCOUNTER — Other Ambulatory Visit: Payer: Self-pay | Admitting: Certified Nurse Midwife

## 2014-09-05 VITALS — BP 110/69 | HR 91 | Temp 98.6°F | Wt 147.0 lb

## 2014-09-05 DIAGNOSIS — Z3483 Encounter for supervision of other normal pregnancy, third trimester: Secondary | ICD-10-CM

## 2014-09-05 NOTE — Progress Notes (Signed)
Subjective:    Tracie Garcia is a 29 y.o. female being seen today for her obstetrical visit. She is at [redacted]w[redacted]d gestation. Patient reports no complaints. Fetal movement: normal.  Problem List Items Addressed This Visit    None    Visit Diagnoses    Encounter for supervision of other normal pregnancy in third trimester    -  Primary    Relevant Orders    POCT urinalysis dipstick      Patient Active Problem List   Diagnosis Date Noted  . Hyperemesis gravidarum before end of [redacted] week gestation, dehydration 03/26/2014  . Thrombosed external hemorrhoids 07/20/2012   Objective:    BP 110/69 mmHg  Pulse 91  Temp(Src) 98.6 F (37 C)  Wt 147 lb (66.679 kg)  LMP 02/11/2014 FHT:  150 BPM  Uterine Size: size equals dates  Presentation: unsure     Assessment:    Pregnancy @ [redacted]w[redacted]d weeks   Plan:     labs reviewed, problem list updated Consent signed. GBS sent TDAP offered  Rhogam given for RH negative Pediatrician: discussed. Infant feeding: plans to breastfeed. Maternity leave: discussed. Cigarette smoking: former smoker. Orders Placed This Encounter  Procedures  . POCT urinalysis dipstick   No orders of the defined types were placed in this encounter.   Follow up in 2 Weeks.

## 2014-09-19 ENCOUNTER — Encounter: Payer: Self-pay | Admitting: Obstetrics

## 2014-09-19 ENCOUNTER — Ambulatory Visit (INDEPENDENT_AMBULATORY_CARE_PROVIDER_SITE_OTHER): Payer: Medicaid Other | Admitting: Obstetrics

## 2014-09-19 VITALS — BP 105/71 | HR 83 | Temp 97.7°F | Wt 149.0 lb

## 2014-09-19 DIAGNOSIS — Z3483 Encounter for supervision of other normal pregnancy, third trimester: Secondary | ICD-10-CM

## 2014-09-19 NOTE — Progress Notes (Signed)
Subjective:    Tracie Garcia is a 29 y.o. female being seen today for her obstetrical visit. She is at [redacted]w[redacted]d gestation. Patient reports no complaints. Fetal movement: normal.  Problem List Items Addressed This Visit    None    Visit Diagnoses    Encounter for supervision of other normal pregnancy in third trimester    -  Primary    Relevant Orders    POCT urinalysis dipstick      Patient Active Problem List   Diagnosis Date Noted  . Hyperemesis gravidarum before end of [redacted] week gestation, dehydration 03/26/2014  . Thrombosed external hemorrhoids 07/20/2012   Objective:    BP 105/71 mmHg  Pulse 83  Temp(Src) 97.7 F (36.5 C)  Wt 149 lb (67.586 kg)  LMP 02/11/2014 FHT:  150 BPM  Uterine Size: size equals dates  Presentation: unsure     Assessment:    Pregnancy @ [redacted]w[redacted]d weeks   Plan:     labs reviewed, problem list updated Consent signed. GBS sent TDAP offered  Rhogam given for RH negative Pediatrician: discussed. Infant feeding: plans to breastfeed. Maternity leave: discussed. Cigarette smoking: former smoker Orders Placed This Encounter  Procedures  . POCT urinalysis dipstick   No orders of the defined types were placed in this encounter.   Follow up in 2 Weeks

## 2014-10-03 ENCOUNTER — Encounter: Payer: Medicaid Other | Admitting: Obstetrics

## 2014-10-03 ENCOUNTER — Ambulatory Visit (INDEPENDENT_AMBULATORY_CARE_PROVIDER_SITE_OTHER): Payer: Medicaid Other | Admitting: Obstetrics

## 2014-10-03 VITALS — BP 111/74 | HR 82 | Temp 98.6°F | Wt 151.0 lb

## 2014-10-03 DIAGNOSIS — O0943 Supervision of pregnancy with grand multiparity, third trimester: Secondary | ICD-10-CM

## 2014-10-03 NOTE — Patient Instructions (Signed)
Group B Streptococcus Infection During Pregnancy Group B streptococcus (GBS) is a type of bacteria often found in healthy women. GBS is not the same as the bacteria that causes strep throat. You may have GBS in your vagina, rectum, or bladder. GBS does not spread through sexual contact, but it can be passed to a baby during childbirth. This can be dangerous for your baby. It is not dangerous to you and usually does not cause any symptoms. Your health care provider may test you for GBS when your pregnancy is between 35 and 37 weeks. GBS is dangerous only during birth, so there is no need to test for it earlier. It is possible to have GBS during pregnancy and never pass it to your baby. If your test results are positive for GBS, your health care provider may recommend giving you antibiotic medicine during delivery to make sure your baby stays healthy. RISK FACTORS You are more likely to pass GBS to your baby if:   Your water breaks (ruptured membrane) or you go into labor before 37 weeks.  Your water breaks 18 hours before you deliver.  You passed GBS during a previous pregnancy.  You have a urinary tract infection caused by GBS any time during pregnancy.  You have a fever during labor. SYMPTOMS Most women who have GBS do not have any symptoms. If you have a urinary tract infection caused by GBS, you might have frequent or painful urination and fever. Babies who get GBS usually show symptoms within 7 days of birth. Symptoms may include:   Breathing problems.  Heart and blood pressure problems.  Digestive and kidney problems. DIAGNOSIS Routine screening for GBS is recommended for all pregnant women. A health care provider takes a sample of the fluid in your vagina and rectum with a swab. It is then sent to a lab to be checked for GBS. A sample of your urine may also be checked for the bacteria.  TREATMENT If you test positive for GBS, you may need treatment with an antibiotic medicine during  labor. As soon as you go into labor, or as soon as your membranes rupture, you will get the antibiotic medicine through an IV access. You will continue to get the medicine until after you give birth. You do not need antibiotic medicine if you are having a cesarean delivery.If your baby shows signs or symptoms of GBS after birth, your baby can also be treated with an antibiotic medicine. HOME CARE INSTRUCTIONS   Take all antibiotic medicine as prescribed by your health care provider. Only take medicine as directed.   Continue with prenatal visits and care.   Keep all follow-up appointments.  SEEK MEDICAL CARE IF:   You have pain when you urinate.   You have to urinate frequently.   You have a fever.  SEEK IMMEDIATE MEDICAL CARE IF:   Your membranes rupture.  You go into labor. Document Released: 03/30/2007 Document Revised: 12/26/2012 Document Reviewed: 10/13/2012 [employer] Patient Information 2015 [employer]. This information is not intended to replace advice given to you by your health care provider. Make sure you discuss any questions you have with your health care provider.  

## 2014-10-03 NOTE — Progress Notes (Signed)
Patient is having a lot of discomfort and pressure in her vagina.

## 2014-10-04 ENCOUNTER — Encounter: Payer: Self-pay | Admitting: Obstetrics

## 2014-10-04 NOTE — Progress Notes (Signed)
Subjective:    Tracie Garcia is a 29 y.o. female being seen today for her obstetrical visit. She is at [redacted]w[redacted]d gestation. Patient reports backache and occasional contractions. Fetal movement: normal.  Problem List Items Addressed This Visit    None    Visit Diagnoses    Supervision of pregnancy with grand multiparity in third trimester    -  Primary    Relevant Orders    POCT urinalysis dipstick    Strep B DNA probe      Patient Active Problem List   Diagnosis Date Noted  . Hyperemesis gravidarum before end of [redacted] week gestation, dehydration 03/26/2014  . Thrombosed external hemorrhoids 07/20/2012   Objective:    BP 111/74 mmHg  Pulse 82  Temp(Src) 98.6 F (37 C)  Wt 151 lb (68.493 kg)  LMP 02/11/2014 FHT:  150 BPM  Uterine Size: size equals dates  Presentation: unsure     Assessment:    Pregnancy @ [redacted]w[redacted]d weeks   Plan:     labs reviewed, problem list updated Consent signed. GBS sent TDAP offered  Rhogam given for RH negative Pediatrician: discussed. Infant feeding: plans to breastfeed. Maternity leave: discussed. Cigarette smoking: former smoker. Orders Placed This Encounter  Procedures  . Strep B DNA probe  . POCT urinalysis dipstick   No orders of the defined types were placed in this encounter.   Follow up in 1 Week.

## 2014-10-05 LAB — STREP B DNA PROBE: GBSP: DETECTED

## 2014-10-07 ENCOUNTER — Encounter: Payer: Medicaid Other | Admitting: Obstetrics

## 2014-10-10 ENCOUNTER — Ambulatory Visit (INDEPENDENT_AMBULATORY_CARE_PROVIDER_SITE_OTHER): Payer: Medicaid Other | Admitting: Obstetrics

## 2014-10-10 VITALS — BP 113/76 | HR 84 | Temp 98.0°F | Wt 152.0 lb

## 2014-10-10 DIAGNOSIS — R52 Pain, unspecified: Secondary | ICD-10-CM

## 2014-10-10 DIAGNOSIS — Z3483 Encounter for supervision of other normal pregnancy, third trimester: Secondary | ICD-10-CM

## 2014-10-10 MED ORDER — OXYCODONE HCL 10 MG PO TABS: 10.0000 mg | ORAL_TABLET | Freq: Four times a day (QID) | ORAL | Status: DC | PRN

## 2014-10-11 ENCOUNTER — Encounter: Payer: Self-pay | Admitting: Obstetrics

## 2014-10-11 NOTE — Progress Notes (Signed)
Subjective:    Tracie Garcia is a 29 y.o. female being seen today for her obstetrical visit. She is at [redacted]w[redacted]d gestation. Patient reports backache and occasional contractions. Fetal movement: normal.  Problem List Items Addressed This Visit    None    Visit Diagnoses    Supervision of other normal pregnancy, antepartum, third trimester    -  Primary    Relevant Orders    POCT urinalysis dipstick    Pain aggravated by activities of daily living        Relevant Medications    Oxycodone HCl 10 MG TABS      Patient Active Problem List   Diagnosis Date Noted  . Hyperemesis gravidarum before end of [redacted] week gestation, dehydration 03/26/2014  . Thrombosed external hemorrhoids 07/20/2012   Objective:    BP 113/76 mmHg  Pulse 84  Temp(Src) 98 F (36.7 C)  Wt 152 lb (68.947 kg)  LMP 02/11/2014 FHT:  150 BPM  Uterine Size: size greater than dates  Presentation: unsure     Assessment:    Pregnancy @ [redacted]w[redacted]d weeks   Plan:     labs reviewed, problem list updated Consent signed. GBS sent TDAP offered  Rhogam given for RH negative Pediatrician: discussed. Infant feeding: plans to breastfeed. Maternity leave: discussed. Cigarette smoking: former smoker. Orders Placed This Encounter  Procedures  . POCT urinalysis dipstick   Meds ordered this encounter  Medications  . Oxycodone HCl 10 MG TABS    Sig: Take 1 tablet (10 mg total) by mouth every 6 (six) hours as needed.    Dispense:  40 tablet    Refill:  0   Follow up in 1 Week.

## 2014-10-17 ENCOUNTER — Encounter: Payer: Self-pay | Admitting: Obstetrics

## 2014-10-17 ENCOUNTER — Ambulatory Visit (INDEPENDENT_AMBULATORY_CARE_PROVIDER_SITE_OTHER): Payer: Medicaid Other | Admitting: Obstetrics

## 2014-10-17 VITALS — Wt 153.0 lb

## 2014-10-17 DIAGNOSIS — G4700 Insomnia, unspecified: Secondary | ICD-10-CM

## 2014-10-17 DIAGNOSIS — Z3483 Encounter for supervision of other normal pregnancy, third trimester: Secondary | ICD-10-CM

## 2014-10-17 MED ORDER — ZOLPIDEM TARTRATE 5 MG PO TABS: 5.0000 mg | ORAL_TABLET | Freq: Every evening | ORAL | Status: DC | PRN

## 2014-10-17 NOTE — Progress Notes (Signed)
Subjective:    Tracie Garcia is a 29 y.o. female being seen today for her obstetrical visit. She is at 3848w3d gestation. Patient reports no complaints. Fetal movement: normal.  Problem List Items Addressed This Visit    None    Visit Diagnoses    Encounter for supervision of other normal pregnancy in third trimester    -  Primary    Relevant Orders    POCT urinalysis dipstick    Insomnia        Relevant Medications    zolpidem (AMBIEN) 5 MG tablet      Patient Active Problem List   Diagnosis Date Noted  . Hyperemesis gravidarum before end of [redacted] week gestation, dehydration 03/26/2014  . Thrombosed external hemorrhoids 07/20/2012   Objective:    Wt 153 lb (69.4 kg)  LMP 02/11/2014 FHT:  150 BPM  Uterine Size: size greater than dates  Presentation: unsure     Assessment:    Pregnancy @ 3748w3d weeks   Plan:     labs reviewed, problem list updated Consent signed. GBS sent TDAP offered  Rhogam given for RH negative Pediatrician: discussed. Infant feeding: plans to breastfeed. Maternity leave: discussed. Cigarette smoking: former smoker. Orders Placed This Encounter  Procedures  . POCT urinalysis dipstick   Meds ordered this encounter  Medications  . zolpidem (AMBIEN) 5 MG tablet    Sig: Take 1 tablet (5 mg total) by mouth at bedtime as needed for sleep.    Dispense:  30 tablet    Refill:  2   Follow up in 1 Week.

## 2014-10-22 ENCOUNTER — Inpatient Hospital Stay (HOSPITAL_COMMUNITY)
Admission: AD | Admit: 2014-10-22 | Discharge: 2014-10-23 | Disposition: A | Discharge status: Home / Self Care | Payer: Medicaid Other | Source: Ambulatory Visit | Attending: Obstetrics | Admitting: Obstetrics

## 2014-10-22 ENCOUNTER — Encounter (HOSPITAL_COMMUNITY): Payer: Self-pay | Admitting: *Deleted

## 2014-10-22 DIAGNOSIS — N9489 Other specified conditions associated with female genital organs and menstrual cycle: Secondary | ICD-10-CM

## 2014-10-22 DIAGNOSIS — Z3A36 36 weeks gestation of pregnancy: Secondary | ICD-10-CM | POA: Diagnosis not present

## 2014-10-22 DIAGNOSIS — R600 Localized edema: Secondary | ICD-10-CM | POA: Insufficient documentation

## 2014-10-22 DIAGNOSIS — O1203 Gestational edema, third trimester: Secondary | ICD-10-CM | POA: Diagnosis present

## 2014-10-22 DIAGNOSIS — Z87891 Personal history of nicotine dependence: Secondary | ICD-10-CM | POA: Insufficient documentation

## 2014-10-22 LAB — WET PREP, GENITAL
Clue Cells Wet Prep HPF POC: NONE SEEN
Trich, Wet Prep: NONE SEEN
YEAST WET PREP: NONE SEEN

## 2014-10-22 NOTE — MAU Note (Signed)
PT SAYS  ON Monday  SHE  STARTED FEELING  LIKE HER VAGINA  IS IRRITATED-  HURTS  TO WIPE  AND  FEELS  SWOLLEN.    NO VAG  D/C , NO BURNING, NO ITCHING. Marland Kitchen.  LAST SEX-   SEPT

## 2014-10-22 NOTE — MAU Provider Note (Signed)
History     CSN: [id number]  Arrival date and time: 10/22/14 2248   First Provider Initiated Contact with Patient 10/22/14 2319      No chief complaint on file.  HPI  Ms. Tracie Garcia is a 29 y.o. [id number] at [redacted]w[redacted]d who presents to MAU today with complaint of vaginal irritation since Monday. The patient denies vaginal bleeding, discharge, LOF, itching or recent intercourse. She also denies pain except with wiping. She feels that the area is swollen. She states few irregular contractions noted mostly at night. She denies complications with this pregnancy. She reports good fetal movement.   OB History    Gravida Para Term Preterm AB TAB SAB Ectopic Multiple Living   0 3 3 0 0 0 2      Past Medical History  Diagnosis Date  . Abnormal Pap smear   . Gonorrhea   . Chlamydia   . Headache     Past Surgical History  Procedure Laterality Date  . Colposcopy    . Dilation and curettage of uterus      tab x2  . Therapeutic abortion      Family History  Problem Relation Age of Onset  . Adopted: Yes  . Alcohol abuse Neg Hx     Social History  Substance Use Topics  . Smoking status: Former Smoker -- 0.25 packs/day    Types: Cigarettes  . Smokeless tobacco: Never Used  . Alcohol Use: No    Allergies:  Allergies  Allergen Reactions  . Monistat [Miconazole] Swelling    Prescriptions prior to admission  Medication Sig Dispense Refill Last Dose  . zolpidem (AMBIEN) 5 MG tablet Take 1 tablet (5 mg total) by mouth at bedtime as needed for sleep. 30 tablet 2 10/21/2014 at Unknown time  . docusate sodium (COLACE) 100 MG capsule Take 1 capsule (100 mg total) by mouth 2 (two) times daily. (Patient not taking: Reported on 08/22/2014) 60 capsule 11 Not Taking  . docusate sodium (COLACE) 250 MG capsule Take 1 capsule (250 mg total) by mouth daily. (Patient not taking: Reported on 08/22/2014) 20 capsule 0 Not Taking  . hydrocortisone (ANUSOL-HC) 25 MG suppository Place 1  suppository (25 mg total) rectally 2 (two) times daily. (Patient not taking: Reported on 08/22/2014) 24 suppository 11 Not Taking  . hydrocortisone-pramoxine (PROCTOFOAM HC) rectal foam Place 1 applicator rectally 2 (two) times daily. (Patient not taking: Reported on 08/22/2014) 10 g 0 Not Taking  . Iron-FA-B Cmp-C-Biot-Probiotic (FUSION PLUS) CAPS Take 1 capsule by mouth daily before breakfast. (Patient not taking: Reported on 08/04/2014) 90 capsule 3 Not Taking  . levothyroxine (SYNTHROID, LEVOTHROID) 75 MCG tablet Take 75 mcg by mouth daily.    Taking  . Oxycodone HCl 10 MG TABS Take 1 tablet (10 mg total) by mouth every 6 (six) hours as needed. 40 tablet 0   . oxyCODONE-acetaminophen (PERCOCET) 5-325 MG per tablet Take 1-2 tablets by mouth every 4 (four) hours as needed for severe pain. (Patient not taking: Reported on 08/22/2014) 20 tablet 0 Not Taking  . oxyCODONE-acetaminophen (PERCOCET/ROXICET) 5-325 MG per tablet Take 1 tablet by mouth every 6 (six) hours as needed for severe pain. (Patient not taking: Reported on 08/22/2014) 12 tablet 0 Not Taking  . Prenat-FeCbn-FeAspGl-FA-Omega (OB COMPLETE PETITE) 35-5-1-200 MG CAPS Take 1 capsule by mouth daily. 30 capsule 11 Taking    Review of Systems  Constitutional: Negative for fever and malaise/fatigue.  Gastrointestinal: Negative for abdominal pain.  Genitourinary:  Negative for dysuria, urgency and frequency.       Neg - vaginal bleeding, discharge, LOF, itching   Physical Exam   Blood pressure 110/68, pulse 76, temperature 98.1 F (36.7 C), temperature source Oral, resp. rate 18, height 4\' 10"  (1.473 m), weight 154 lb 4 oz (69.967 kg), last menstrual period 02/11/2014.  Physical Exam  Nursing note and vitals reviewed. Constitutional: She is oriented to person, place, and time. She appears well-developed and well-nourished. No distress.  HENT:  Head: Normocephalic and atraumatic.  Cardiovascular: Normal rate.   Respiratory: Effort normal.    GI: Soft. She exhibits no distension. There is no tenderness.  Genitourinary:    There is tenderness on the right labia. There is tenderness (mild) on the left labia. Uterus is enlarged. Cervix exhibits no motion tenderness, no discharge and no friability. No bleeding in the vagina. Vaginal discharge (small amount of thick, white discharge noted) found.  Neurological: She is alert and oriented to person, place, and time.  Skin: Skin is warm and dry. No erythema.  Psychiatric: She has a normal mood and affect.  Dilation: 1 Effacement (%): Thick Cervical Position: Middle Station: -3 Presentation: Vertex Exam by:: Harlon FlorJ Wenzel, PA-C  Results for orders placed or performed during the hospital encounter of 10/22/14 (from the past 24 hour(s))  Wet prep, genital     Status: Abnormal   Collection Time: 10/22/14 11:32 PM  Result Value Ref Range   Yeast Wet Prep HPF POC NONE SEEN NONE SEEN   Trich, Wet Prep NONE SEEN NONE SEEN   Clue Cells Wet Prep HPF POC NONE SEEN NONE SEEN   WBC, Wet Prep HPF POC FEW (A) NONE SEEN    Fetal Monitoring: Baseline: 130 bpm, moderate variability, + accelerations, no decelerations Contractions: none, mild UI  MAU Course  Procedures None  MDM Wet prep today  Assessment and Plan  A: SIUP at 2528w1d Labial swelling  P: Discharge home Rx for triamcinolone cream given to be used externally only no more than BID Patient advised to use ice to the area as well for symptomatic relief Symptoms for worsening condition and labor precautions discussed Patient advised to follow-up with Dr. Clearance CootsHarper as scheduled this week and have labia re-assessed at that time if symptoms have not improved Patient may return to MAU as needed or if her condition were to change or worsen  Marny LowensteinJulie N Wenzel, PA-C  10/23/2014, 12:21 AM

## 2014-10-23 DIAGNOSIS — N9489 Other specified conditions associated with female genital organs and menstrual cycle: Secondary | ICD-10-CM

## 2014-10-23 MED ORDER — TRIAMCINOLONE ACETONIDE 0.1 % EX CREA: 1.0000 "application " | TOPICAL_CREAM | Freq: Two times a day (BID) | CUTANEOUS | Status: DC

## 2014-10-23 NOTE — Discharge Instructions (Signed)
Braxton Hicks Contractions °Contractions of the uterus can occur throughout pregnancy. Contractions are not always a sign that you are in labor.  °WHAT ARE BRAXTON HICKS CONTRACTIONS?  °Contractions that occur before labor are called Braxton Hicks contractions, or false labor. Toward the end of pregnancy (32-34 weeks), these contractions can develop more often and may become more forceful. This is not true labor because these contractions do not result in opening (dilatation) and thinning of the cervix. They are sometimes difficult to tell apart from true labor because these contractions can be forceful and people have different pain tolerances. You should not feel embarrassed if you go to the hospital with false labor. Sometimes, the only way to tell if you are in true labor is for your health care provider to look for changes in the cervix. °If there are no prenatal problems or other health problems associated with the pregnancy, it is completely safe to be sent home with false labor and await the onset of true labor. °HOW CAN YOU TELL THE DIFFERENCE BETWEEN TRUE AND FALSE LABOR? °False Labor °· The contractions of false labor are usually shorter and not as hard as those of true labor.   °· The contractions are usually irregular.   °· The contractions are often felt in the front of the lower abdomen and in the groin.   °· The contractions may go away when you walk around or change positions while lying down.   °· The contractions get weaker and are shorter lasting as time goes on.   °· The contractions do not usually become progressively stronger, regular, and closer together as with true labor.   °True Labor °· Contractions in true labor last 30-70 seconds, become very regular, usually become more intense, and increase in frequency.   °· The contractions do not go away with walking.   °· The discomfort is usually felt in the top of the uterus and spreads to the lower abdomen and low back.   °· True labor can be  determined by your health care provider with an exam. This will show that the cervix is dilating and getting thinner.   °WHAT TO REMEMBER °· Keep up with your usual exercises and follow other instructions given by your health care provider.   °· Take medicines as directed by your health care provider.   °· Keep your regular prenatal appointments.   °· Eat and drink lightly if you think you are going into labor.   °· If Braxton Hicks contractions are making you uncomfortable:   °¨ Change your position from lying down or resting to walking, or from walking to resting.   °¨ Sit and rest in a tub of warm water.   °¨ Drink 2-3 glasses of water. Dehydration may cause these contractions.   °¨ Do slow and deep breathing several times an hour.   °WHEN SHOULD I SEEK IMMEDIATE MEDICAL CARE? °Seek immediate medical care if: °· Your contractions become stronger, more regular, and closer together.   °· You have fluid leaking or gushing from your vagina.   °· You have a fever.   °· You pass blood-tinged mucus.   °· You have vaginal bleeding.   °· You have continuous abdominal pain.   °· You have low back pain that you never had before.   °· You feel your baby's head pushing down and causing pelvic pressure.   °· Your baby is not moving as much as it used to.   °  °This information is not intended to replace advice given to you by your health care provider. Make sure you discuss any questions you have with your health care   provider. °  °Document Released: 12/21/2004 Document Revised: 12/26/2012 Document Reviewed: 10/02/2012 °Elsevier Interactive Patient Education ©2016 [employer] ° °

## 2014-10-24 ENCOUNTER — Encounter: Payer: Self-pay | Admitting: Obstetrics

## 2014-10-24 ENCOUNTER — Ambulatory Visit (INDEPENDENT_AMBULATORY_CARE_PROVIDER_SITE_OTHER): Payer: Medicaid Other | Admitting: Obstetrics

## 2014-10-24 VITALS — BP 114/69 | HR 77 | Wt 154.0 lb

## 2014-10-24 DIAGNOSIS — Z3483 Encounter for supervision of other normal pregnancy, third trimester: Secondary | ICD-10-CM

## 2014-10-24 NOTE — Progress Notes (Signed)
Subjective:    Tracie Garcia is a 29 y.o. female being seen today for her obstetrical visit. She is at 5343w3d gestation. Patient reports no complaints. Fetal movement: normal.  Problem List Items Addressed This Visit    None    Visit Diagnoses    Encounter for supervision of other normal pregnancy in third trimester    -  Primary    Relevant Orders    POCT urinalysis dipstick      Patient Active Problem List   Diagnosis Date Noted  . Hyperemesis gravidarum before end of [redacted] week gestation, dehydration 03/26/2014  . Thrombosed external hemorrhoids 07/20/2012   Objective:    BP 114/69 mmHg  Pulse 77  Wt 154 lb (69.854 kg)  LMP 02/11/2014 FHT:  150 BPM  Uterine Size: size equals dates  Presentation: unsure     Assessment:    Pregnancy @ 7343w3d weeks   Plan:     labs reviewed, problem list updated Consent signed. GBS sent TDAP offered  Rhogam given for RH negative Pediatrician: discussed. Infant feeding: plans to breastfeed. Maternity leave: discussed. Cigarette smoking: never smoked. Orders Placed This Encounter  Procedures  . POCT urinalysis dipstick   No orders of the defined types were placed in this encounter.   Follow up in 1 Week.

## 2014-10-30 ENCOUNTER — Encounter: Payer: Self-pay | Admitting: Obstetrics

## 2014-10-30 ENCOUNTER — Ambulatory Visit (INDEPENDENT_AMBULATORY_CARE_PROVIDER_SITE_OTHER): Payer: Medicaid Other | Admitting: Obstetrics

## 2014-10-30 VITALS — BP 116/70 | HR 84 | Wt 156.0 lb

## 2014-10-30 DIAGNOSIS — Z3483 Encounter for supervision of other normal pregnancy, third trimester: Secondary | ICD-10-CM

## 2014-10-30 LAB — POCT URINALYSIS DIPSTICK
Bilirubin, UA: NEGATIVE
Glucose, UA: NEGATIVE
Ketones, UA: NEGATIVE
Nitrite, UA: NEGATIVE
PH UA: 5.5
Protein, UA: NEGATIVE
RBC UA: NEGATIVE
Spec Grav, UA: <=1.005
Urobilinogen, UA: NEGATIVE

## 2014-10-30 NOTE — Progress Notes (Signed)
Subjective:    Tracie Garcia is a 29 y.o. female being seen today for her obstetrical visit. She is at 4721w2d gestation. Patient reports backache, occasional contractions and pressure. Fetal movement: normal.  Problem List Items Addressed This Visit    None    Visit Diagnoses    Encounter for supervision of other normal pregnancy in third trimester    -  Primary    Relevant Orders    POCT urinalysis dipstick (Completed)      Patient Active Problem List   Diagnosis Date Noted  . Hyperemesis gravidarum before end of [redacted] week gestation, dehydration 03/26/2014  . Thrombosed external hemorrhoids 07/20/2012    Objective:    BP 116/70 mmHg  Pulse 84  Wt 156 lb (70.761 kg)  LMP 02/11/2014 FHT: 150 BPM  Uterine Size: size equals dates  Presentations: unsure    Assessment:    Pregnancy @ 3621w2d weeks   Plan:   Plans for delivery: Vaginal anticipated; labs reviewed; problem list updated Counseling: Consent signed. Infant feeding: plans to breastfeed. Cigarette smoking: former smoker. L&D discussion: symptoms of labor, discussed when to call, discussed what number to call, anesthetic/analgesic options reviewed and delivering clinician:  plans no preference. Postpartum supports and preparation: circumcision discussed and contraception plans discussed.  Follow up in 1 Week.

## 2014-11-06 ENCOUNTER — Ambulatory Visit (INDEPENDENT_AMBULATORY_CARE_PROVIDER_SITE_OTHER): Payer: Medicaid Other | Admitting: Obstetrics

## 2014-11-06 VITALS — BP 114/70 | HR 76 | Wt 158.0 lb

## 2014-11-06 DIAGNOSIS — Z3483 Encounter for supervision of other normal pregnancy, third trimester: Secondary | ICD-10-CM

## 2014-11-06 LAB — POCT URINALYSIS DIPSTICK
Bilirubin, UA: NEGATIVE
Blood, UA: NEGATIVE
Glucose, UA: NEGATIVE
KETONES UA: NEGATIVE
Nitrite, UA: NEGATIVE
PH UA: 5
PROTEIN UA: NEGATIVE
UROBILINOGEN UA: 1

## 2014-11-06 NOTE — Progress Notes (Signed)
Pt states that she thinks she is passing mucous plug, increase in d/c green in color.

## 2014-11-07 ENCOUNTER — Encounter: Payer: Self-pay | Admitting: Obstetrics

## 2014-11-07 NOTE — Progress Notes (Signed)
Subjective:    Tracie Garcia is a 29 y.o. female being seen today for her obstetrical visit. She is at 173w3d gestation. Patient reports backache, occasional contractions and pressure. Fetal movement: normal.  Problem List Items Addressed This Visit    None    Visit Diagnoses    Encounter for supervision of other normal pregnancy in third trimester    -  Primary    Relevant Orders    POCT urinalysis dipstick (Completed)      Patient Active Problem List   Diagnosis Date Noted  . Hyperemesis gravidarum before end of [redacted] week gestation, dehydration 03/26/2014  . Thrombosed external hemorrhoids 07/20/2012    Objective:    BP 114/70 mmHg  Pulse 76  Wt 158 lb (71.668 kg)  LMP 02/11/2014 FHT: 150 BPM  Uterine Size: size equals dates  Presentations: cephalic    Assessment:    Pregnancy @ 8073w3d weeks   Plan:   Plans for delivery: Vaginal anticipated; labs reviewed; problem list updated Counseling: Consent signed. Infant feeding: plans to breastfeed. Cigarette smoking: former smoker. L&D discussion: symptoms of labor, discussed when to call, discussed what number to call, anesthetic/analgesic options reviewed and delivering clinician:  plans no preference. Postpartum supports and preparation: circumcision discussed and contraception plans discussed.  Follow up in 1 Week.

## 2014-11-08 ENCOUNTER — Encounter (HOSPITAL_COMMUNITY)
Admission: AD | Disposition: A | Discharge status: Home / Self Care | Payer: Self-pay | Source: Ambulatory Visit | Attending: Obstetrics

## 2014-11-08 ENCOUNTER — Inpatient Hospital Stay (HOSPITAL_COMMUNITY): Payer: Medicaid Other

## 2014-11-08 ENCOUNTER — Encounter (HOSPITAL_COMMUNITY): Payer: Self-pay | Admitting: *Deleted

## 2014-11-08 ENCOUNTER — Inpatient Hospital Stay (HOSPITAL_COMMUNITY)
Admission: AD | Admit: 2014-11-08 | Discharge: 2014-11-11 | DRG: 765 | Disposition: A | Discharge status: Home / Self Care | Payer: Medicaid Other | Source: Ambulatory Visit | Attending: Obstetrics | Admitting: Obstetrics

## 2014-11-08 ENCOUNTER — Inpatient Hospital Stay (HOSPITAL_COMMUNITY): Payer: Medicaid Other | Admitting: Certified Registered Nurse Anesthetist

## 2014-11-08 DIAGNOSIS — K219 Gastro-esophageal reflux disease without esophagitis: Secondary | ICD-10-CM | POA: Diagnosis present

## 2014-11-08 DIAGNOSIS — O9962 Diseases of the digestive system complicating childbirth: Secondary | ICD-10-CM | POA: Diagnosis present

## 2014-11-08 DIAGNOSIS — D62 Acute posthemorrhagic anemia: Secondary | ICD-10-CM | POA: Diagnosis present

## 2014-11-08 DIAGNOSIS — Z3A38 38 weeks gestation of pregnancy: Secondary | ICD-10-CM

## 2014-11-08 DIAGNOSIS — Z87891 Personal history of nicotine dependence: Secondary | ICD-10-CM

## 2014-11-08 DIAGNOSIS — Z9889 Other specified postprocedural states: Secondary | ICD-10-CM

## 2014-11-08 DIAGNOSIS — Z98891 History of uterine scar from previous surgery: Secondary | ICD-10-CM

## 2014-11-08 DIAGNOSIS — O4593 Premature separation of placenta, unspecified, third trimester: Secondary | ICD-10-CM | POA: Diagnosis present

## 2014-11-08 HISTORY — DX: Unspecified abnormal cytological findings in specimens from vagina: R87.629

## 2014-11-08 LAB — CBC
HEMATOCRIT: 29.4 % — AB (ref 36.0–46.0)
HEMOGLOBIN: 10.2 g/dL — AB (ref 12.0–15.0)
MCH: 30.5 pg (ref 26.0–34.0)
MCHC: 34.7 g/dL (ref 30.0–36.0)
MCV: 88 fL (ref 78.0–100.0)
Platelets: 231 10*3/uL (ref 150–400)
RBC: 3.34 MIL/uL — AB (ref 3.87–5.11)
RDW: 13.9 % (ref 11.5–15.5)
WBC: 9.4 10*3/uL (ref 4.0–10.5)

## 2014-11-08 LAB — RPR: RPR Ser Ql: NONREACTIVE

## 2014-11-08 LAB — RAPID URINE DRUG SCREEN, HOSP PERFORMED
Amphetamines: NOT DETECTED
BENZODIAZEPINES: NOT DETECTED
Barbiturates: NOT DETECTED
Cocaine: NOT DETECTED
Opiates: NOT DETECTED
Tetrahydrocannabinol: POSITIVE — AB

## 2014-11-08 LAB — TYPE AND SCREEN
ABO/RH(D): O POS
ANTIBODY SCREEN: NEGATIVE

## 2014-11-08 SURGERY — Surgical Case
Anesthesia: Spinal

## 2014-11-08 MED ORDER — OXYCODONE-ACETAMINOPHEN 5-325 MG PO TABS: 2.0000 | ORAL_TABLET | ORAL | Status: DC | PRN

## 2014-11-08 MED ORDER — ACETAMINOPHEN 325 MG PO TABS: 650.0000 mg | ORAL_TABLET | ORAL | Status: DC | PRN

## 2014-11-08 MED ORDER — PRENATAL MULTIVITAMIN CH
1.0000 | ORAL_TABLET | Freq: Every day | ORAL | Status: DC
  Administered 2014-11-09 – 2014-11-10 (×2): 1 via ORAL
  Filled 2014-11-08 (×2): qty 1

## 2014-11-08 MED ORDER — ONDANSETRON HCL 4 MG/2ML IJ SOLN: 4.0000 mg | Freq: Four times a day (QID) | INTRAMUSCULAR | Status: DC | PRN

## 2014-11-08 MED ORDER — FENTANYL CITRATE (PF) 250 MCG/5ML IJ SOLN
INTRAMUSCULAR | Status: AC
  Filled 2014-11-08: qty 25

## 2014-11-08 MED ORDER — MEPERIDINE HCL 25 MG/ML IJ SOLN: 6.2500 mg | INTRAMUSCULAR | Status: DC | PRN

## 2014-11-08 MED ORDER — LEVOTHYROXINE SODIUM 75 MCG PO TABS
75.0000 ug | ORAL_TABLET | Freq: Every day | ORAL | Status: DC
  Administered 2014-11-09 – 2014-11-11 (×3): 75 ug via ORAL
  Filled 2014-11-08 (×4): qty 1

## 2014-11-08 MED ORDER — HYDROMORPHONE HCL 1 MG/ML IJ SOLN
INTRAMUSCULAR | Status: AC
  Filled 2014-11-08: qty 1

## 2014-11-08 MED ORDER — LIDOCAINE HCL (CARDIAC) 20 MG/ML IV SOLN: INTRAVENOUS | Status: DC | PRN

## 2014-11-08 MED ORDER — LEVOTHYROXINE SODIUM 75 MCG PO TABS
75.0000 ug | ORAL_TABLET | Freq: Every day | ORAL | Status: DC
  Filled 2014-11-08 (×2): qty 1

## 2014-11-08 MED ORDER — HYDROMORPHONE HCL 1 MG/ML IJ SOLN
0.2500 mg | INTRAMUSCULAR | Status: DC | PRN
  Administered 2014-11-08 (×4): 0.5 mg via INTRAVENOUS

## 2014-11-08 MED ORDER — PENICILLIN G POTASSIUM 5000000 UNITS IJ SOLR
5.0000 10*6.[IU] | Freq: Once | INTRAVENOUS | Status: AC
  Administered 2014-11-08: 5 10*6.[IU] via INTRAVENOUS
  Filled 2014-11-08: qty 5

## 2014-11-08 MED ORDER — MENTHOL 3 MG MT LOZG
1.0000 | LOZENGE | OROMUCOSAL | Status: DC | PRN
Start: 2014-11-08 — End: 2014-11-11

## 2014-11-08 MED ORDER — DIBUCAINE 1 % RE OINT
1.0000 "application " | TOPICAL_OINTMENT | RECTAL | Status: DC | PRN
  Administered 2014-11-10: 1 via RECTAL
  Filled 2014-11-08: qty 28

## 2014-11-08 MED ORDER — TETANUS-DIPHTH-ACELL PERTUSSIS 5-2.5-18.5 LF-MCG/0.5 IM SUSP: 0.5000 mL | Freq: Once | INTRAMUSCULAR | Status: DC

## 2014-11-08 MED ORDER — OXYTOCIN 10 UNIT/ML IJ SOLN
INTRAMUSCULAR | Status: AC
  Filled 2014-11-08: qty 4

## 2014-11-08 MED ORDER — METOCLOPRAMIDE HCL 5 MG/ML IJ SOLN
INTRAMUSCULAR | Status: AC
  Filled 2014-11-08: qty 2

## 2014-11-08 MED ORDER — OXYTOCIN BOLUS FROM INFUSION: 500.0000 mL | INTRAVENOUS | Status: DC

## 2014-11-08 MED ORDER — ZOLPIDEM TARTRATE 5 MG PO TABS
5.0000 mg | ORAL_TABLET | Freq: Every evening | ORAL | Status: DC | PRN
  Filled 2014-11-08 (×2): qty 1

## 2014-11-08 MED ORDER — HYDROMORPHONE HCL 1 MG/ML IJ SOLN
INTRAMUSCULAR | Status: DC | PRN
  Administered 2014-11-08: 1 mg via INTRAVENOUS

## 2014-11-08 MED ORDER — SIMETHICONE 80 MG PO CHEW
80.0000 mg | CHEWABLE_TABLET | ORAL | Status: DC
  Administered 2014-11-08 – 2014-11-11 (×3): 80 mg via ORAL
  Filled 2014-11-08 (×3): qty 1

## 2014-11-08 MED ORDER — NALBUPHINE HCL 10 MG/ML IJ SOLN: 10.0000 mg | Freq: Once | INTRAMUSCULAR | Status: DC | PRN

## 2014-11-08 MED ORDER — LACTATED RINGERS IV SOLN
INTRAVENOUS | Status: DC | PRN
  Administered 2014-11-08: 13:00:00 via INTRAVENOUS

## 2014-11-08 MED ORDER — OXYTOCIN 40 UNITS IN LACTATED RINGERS INFUSION - SIMPLE MED
62.5000 mL/h | INTRAVENOUS | Status: AC
  Administered 2014-11-08: 62.5 mL/h via INTRAVENOUS
  Filled 2014-11-08: qty 1000

## 2014-11-08 MED ORDER — FENTANYL CITRATE (PF) 100 MCG/2ML IJ SOLN
INTRAMUSCULAR | Status: AC
  Filled 2014-11-08: qty 4

## 2014-11-08 MED ORDER — LACTATED RINGERS IV SOLN
INTRAVENOUS | Status: DC
  Administered 2014-11-08: 09:00:00 via INTRAVENOUS

## 2014-11-08 MED ORDER — OXYTOCIN 40 UNITS IN LACTATED RINGERS INFUSION - SIMPLE MED
1.0000 m[IU]/min | INTRAVENOUS | Status: DC
  Administered 2014-11-08: 2 m[IU]/min via INTRAVENOUS

## 2014-11-08 MED ORDER — TERBUTALINE SULFATE 1 MG/ML IJ SOLN: 0.2500 mg | Freq: Once | INTRAMUSCULAR | Status: DC | PRN

## 2014-11-08 MED ORDER — PROMETHAZINE HCL 25 MG/ML IJ SOLN: 25.0000 mg | Freq: Four times a day (QID) | INTRAMUSCULAR | Status: DC | PRN

## 2014-11-08 MED ORDER — DEXTROSE 5 % IV SOLN
2.0000 g | Freq: Four times a day (QID) | INTRAVENOUS | Status: AC
  Administered 2014-11-09: 2 g via INTRAVENOUS
  Filled 2014-11-08 (×2): qty 2

## 2014-11-08 MED ORDER — WITCH HAZEL-GLYCERIN EX PADS: 1.0000 "application " | MEDICATED_PAD | CUTANEOUS | Status: DC | PRN

## 2014-11-08 MED ORDER — SCOPOLAMINE 1 MG/3DAYS TD PT72
MEDICATED_PATCH | TRANSDERMAL | Status: DC | PRN
  Administered 2014-11-08: 1 via TRANSDERMAL

## 2014-11-08 MED ORDER — DIPHENHYDRAMINE HCL 25 MG PO CAPS: 25.0000 mg | ORAL_CAPSULE | Freq: Four times a day (QID) | ORAL | Status: DC | PRN

## 2014-11-08 MED ORDER — DEXTROSE 5 % IV SOLN
2.5000 10*6.[IU] | INTRAVENOUS | Status: DC
  Filled 2014-11-08 (×5): qty 2.5

## 2014-11-08 MED ORDER — OXYTOCIN 10 UNIT/ML IJ SOLN
40.0000 [IU] | INTRAVENOUS | Status: DC | PRN
  Administered 2014-11-08: 40 [IU] via INTRAVENOUS

## 2014-11-08 MED ORDER — SIMETHICONE 80 MG PO CHEW
80.0000 mg | CHEWABLE_TABLET | Freq: Three times a day (TID) | ORAL | Status: DC
  Administered 2014-11-09 – 2014-11-11 (×6): 80 mg via ORAL
  Filled 2014-11-08 (×6): qty 1

## 2014-11-08 MED ORDER — MIDAZOLAM HCL 2 MG/2ML IJ SOLN
INTRAMUSCULAR | Status: AC
  Filled 2014-11-08: qty 4

## 2014-11-08 MED ORDER — KETOROLAC TROMETHAMINE 30 MG/ML IJ SOLN
30.0000 mg | Freq: Three times a day (TID) | INTRAMUSCULAR | Status: DC
  Administered 2014-11-08 – 2014-11-09 (×2): 30 mg via INTRAVENOUS
  Filled 2014-11-08 (×2): qty 1

## 2014-11-08 MED ORDER — ZOLPIDEM TARTRATE 5 MG PO TABS: 5.0000 mg | ORAL_TABLET | Freq: Every evening | ORAL | Status: DC | PRN

## 2014-11-08 MED ORDER — PROPOFOL 10 MG/ML IV BOLUS
INTRAVENOUS | Status: AC
  Filled 2014-11-08: qty 20

## 2014-11-08 MED ORDER — IBUPROFEN 800 MG PO TABS
800.0000 mg | ORAL_TABLET | Freq: Three times a day (TID) | ORAL | Status: DC
  Administered 2014-11-09 – 2014-11-11 (×6): 800 mg via ORAL
  Filled 2014-11-08 (×8): qty 1

## 2014-11-08 MED ORDER — DEXAMETHASONE SODIUM PHOSPHATE 10 MG/ML IJ SOLN
INTRAMUSCULAR | Status: DC | PRN
  Administered 2014-11-08: 4 mg via INTRAVENOUS

## 2014-11-08 MED ORDER — CEFAZOLIN SODIUM-DEXTROSE 2-3 GM-% IV SOLR
INTRAVENOUS | Status: DC | PRN
  Administered 2014-11-08: 2 g via INTRAVENOUS

## 2014-11-08 MED ORDER — FENTANYL CITRATE (PF) 100 MCG/2ML IJ SOLN
INTRAMUSCULAR | Status: DC | PRN
  Administered 2014-11-08: 250 ug via INTRAVENOUS
  Administered 2014-11-08 (×2): 25 ug via INTRAVENOUS
  Administered 2014-11-08 (×3): 50 ug via INTRAVENOUS

## 2014-11-08 MED ORDER — SIMETHICONE 80 MG PO CHEW: 80.0000 mg | CHEWABLE_TABLET | ORAL | Status: DC | PRN

## 2014-11-08 MED ORDER — SENNOSIDES-DOCUSATE SODIUM 8.6-50 MG PO TABS
2.0000 | ORAL_TABLET | ORAL | Status: DC
  Administered 2014-11-08 – 2014-11-11 (×3): 2 via ORAL
  Filled 2014-11-08 (×3): qty 2

## 2014-11-08 MED ORDER — FLEET ENEMA 7-19 GM/118ML RE ENEM: 1.0000 | ENEMA | RECTAL | Status: DC | PRN

## 2014-11-08 MED ORDER — ONDANSETRON HCL 4 MG/2ML IJ SOLN
INTRAMUSCULAR | Status: DC | PRN
  Administered 2014-11-08: 4 mg via INTRAVENOUS

## 2014-11-08 MED ORDER — NALBUPHINE HCL 10 MG/ML IJ SOLN: 10.0000 mg | Freq: Once | INTRAMUSCULAR | Status: DC

## 2014-11-08 MED ORDER — SUCCINYLCHOLINE CHLORIDE 20 MG/ML IJ SOLN
INTRAMUSCULAR | Status: DC | PRN
  Administered 2014-11-08: 120 mg via INTRAVENOUS

## 2014-11-08 MED ORDER — OXYTOCIN 40 UNITS IN LACTATED RINGERS INFUSION - SIMPLE MED
62.5000 mL/h | INTRAVENOUS | Status: DC
  Filled 2014-11-08: qty 1000

## 2014-11-08 MED ORDER — MIDAZOLAM HCL 2 MG/2ML IJ SOLN
INTRAMUSCULAR | Status: DC | PRN
  Administered 2014-11-08: 2 mg via INTRAVENOUS

## 2014-11-08 MED ORDER — OXYCODONE-ACETAMINOPHEN 5-325 MG PO TABS
1.0000 | ORAL_TABLET | ORAL | Status: DC | PRN
  Administered 2014-11-09 – 2014-11-11 (×9): 1 via ORAL
  Filled 2014-11-08 (×10): qty 1

## 2014-11-08 MED ORDER — CITRIC ACID-SODIUM CITRATE 334-500 MG/5ML PO SOLN
30.0000 mL | ORAL | Status: DC | PRN
  Filled 2014-11-08: qty 15

## 2014-11-08 MED ORDER — FENTANYL CITRATE (PF) 100 MCG/2ML IJ SOLN: 100.0000 ug | Freq: Once | INTRAMUSCULAR | Status: DC

## 2014-11-08 MED ORDER — ONDANSETRON HCL 4 MG/2ML IJ SOLN
INTRAMUSCULAR | Status: AC
  Filled 2014-11-08: qty 2

## 2014-11-08 MED ORDER — LIDOCAINE HCL (PF) 1 % IJ SOLN
30.0000 mL | INTRAMUSCULAR | Status: DC | PRN
  Filled 2014-11-08: qty 30

## 2014-11-08 MED ORDER — SCOPOLAMINE 1 MG/3DAYS TD PT72
MEDICATED_PATCH | TRANSDERMAL | Status: AC
  Filled 2014-11-08: qty 1

## 2014-11-08 MED ORDER — HYDROMORPHONE HCL 1 MG/ML IJ SOLN
1.0000 mg | INTRAMUSCULAR | Status: DC | PRN
  Administered 2014-11-09 (×2): 1 mg via INTRAVENOUS
  Filled 2014-11-08 (×2): qty 1

## 2014-11-08 MED ORDER — PROPOFOL 10 MG/ML IV BOLUS
INTRAVENOUS | Status: DC | PRN
  Administered 2014-11-08: 200 mg via INTRAVENOUS

## 2014-11-08 MED ORDER — LACTATED RINGERS IV SOLN: 500.0000 mL | INTRAVENOUS | Status: DC | PRN

## 2014-11-08 MED ORDER — LANOLIN HYDROUS EX OINT: 1.0000 "application " | TOPICAL_OINTMENT | CUTANEOUS | Status: DC | PRN

## 2014-11-08 MED ORDER — IBUPROFEN 600 MG PO TABS: 600.0000 mg | ORAL_TABLET | Freq: Four times a day (QID) | ORAL | Status: DC

## 2014-11-08 MED ORDER — METOCLOPRAMIDE HCL 5 MG/ML IJ SOLN
10.0000 mg | Freq: Once | INTRAMUSCULAR | Status: AC | PRN
  Administered 2014-11-08: 10 mg via INTRAVENOUS

## 2014-11-08 MED ORDER — OXYCODONE-ACETAMINOPHEN 5-325 MG PO TABS: 1.0000 | ORAL_TABLET | ORAL | Status: DC | PRN

## 2014-11-08 MED ORDER — CEFAZOLIN SODIUM-DEXTROSE 2-3 GM-% IV SOLR
INTRAVENOUS | Status: AC
  Filled 2014-11-08: qty 50

## 2014-11-08 MED ORDER — HYDROMORPHONE HCL 1 MG/ML IJ SOLN
0.5000 mg | INTRAMUSCULAR | Status: DC | PRN
  Administered 2014-11-08: 0.5 mg via INTRAVENOUS
  Filled 2014-11-08: qty 1

## 2014-11-08 MED ORDER — LACTATED RINGERS IV SOLN: INTRAVENOUS | Status: DC

## 2014-11-08 MED ORDER — FENTANYL CITRATE (PF) 100 MCG/2ML IJ SOLN
INTRAMUSCULAR | Status: AC
  Filled 2014-11-08: qty 2

## 2014-11-08 MED ORDER — DEXAMETHASONE SODIUM PHOSPHATE 4 MG/ML IJ SOLN
INTRAMUSCULAR | Status: AC
  Filled 2014-11-08: qty 1

## 2014-11-08 MED ORDER — INFLUENZA VAC SPLIT QUAD 0.5 ML IM SUSY: 0.5000 mL | PREFILLED_SYRINGE | INTRAMUSCULAR | Status: DC

## 2014-11-08 MED ORDER — 0.9 % SODIUM CHLORIDE (POUR BTL) OPTIME
TOPICAL | Status: DC | PRN
  Administered 2014-11-08: 1000 mL

## 2014-11-08 MED ORDER — SUCCINYLCHOLINE CHLORIDE 20 MG/ML IJ SOLN
INTRAMUSCULAR | Status: AC
  Filled 2014-11-08: qty 1

## 2014-11-08 SURGICAL SUPPLY — 31 items
CLAMP CORD UMBIL (MISCELLANEOUS) IMPLANT
CLOTH BEACON ORANGE TIMEOUT ST (SAFETY) ×3 IMPLANT
DRAPE SHEET LG 3/4 BI-LAMINATE (DRAPES) IMPLANT
DRSG OPSITE POSTOP 4X10 (GAUZE/BANDAGES/DRESSINGS) ×3 IMPLANT
DURAPREP 26ML APPLICATOR (WOUND CARE) ×3 IMPLANT
ELECT REM PT RETURN 9FT ADLT (ELECTROSURGICAL) ×3
ELECTRODE REM PT RTRN 9FT ADLT (ELECTROSURGICAL) ×1 IMPLANT
EXTRACTOR VACUUM M CUP 4 TUBE (SUCTIONS) IMPLANT
EXTRACTOR VACUUM M CUP 4' TUBE (SUCTIONS)
GLOVE BIO SURGEON STRL SZ8.5 (GLOVE) ×3 IMPLANT
GOWN STRL REUS W/TWL 2XL LVL3 (GOWN DISPOSABLE) ×3 IMPLANT
GOWN STRL REUS W/TWL LRG LVL3 (GOWN DISPOSABLE) ×3 IMPLANT
KIT ABG SYR 3ML LUER SLIP (SYRINGE) IMPLANT
NEEDLE HYPO 25X5/8 SAFETYGLIDE (NEEDLE) IMPLANT
NS IRRIG 1000ML POUR BTL (IV SOLUTION) ×3 IMPLANT
PACK C SECTION WH (CUSTOM PROCEDURE TRAY) ×3 IMPLANT
PAD OB MATERNITY 4.3X12.25 (PERSONAL CARE ITEMS) ×3 IMPLANT
PENCIL SMOKE EVAC W/HOLSTER (ELECTROSURGICAL) ×3 IMPLANT
SUT CHROMIC 0 CT 802H (SUTURE) ×3 IMPLANT
SUT CHROMIC 0 MO4 CR (SUTURE) IMPLANT
SUT CHROMIC 1 CTX 36 (SUTURE) ×6 IMPLANT
SUT CHROMIC 2 0 SH (SUTURE) ×3 IMPLANT
SUT GUT PLAIN 0 CT-3 TAN 27 (SUTURE) IMPLANT
SUT MON AB 4-0 PS1 27 (SUTURE) ×3 IMPLANT
SUT PDS AB 0 CTX 36 PDP370T (SUTURE) IMPLANT
SUT VIC AB 0 CT1 18XCR BRD8 (SUTURE) IMPLANT
SUT VIC AB 0 CT1 8-18 (SUTURE)
SUT VIC AB 0 CTX 36 (SUTURE) ×6
SUT VIC AB 0 CTX36XBRD ANBCTRL (SUTURE) ×3 IMPLANT
TOWEL OR 17X24 6PK STRL BLUE (TOWEL DISPOSABLE) ×3 IMPLANT
TRAY FOLEY CATH SILVER 14FR (SET/KITS/TRAYS/PACK) ×3 IMPLANT

## 2014-11-08 NOTE — Anesthesia Procedure Notes (Signed)
Date/Time: 11/08/2014 12:38 PM Performed by: Mal AmabileFOSTER, Legacy [place] Pre-anesthesia Checklist: Patient identified, Emergency Drugs available and Suction available Patient Re-evaluated:Patient Re-evaluated prior to inductionOxygen Delivery Method: Circle system utilized Preoxygenation: Pre-oxygenation with 100% oxygen Intubation Type: IV induction, Cricoid Pressure applied and Rapid sequence Laryngoscope Size: 3 and Mac Grade View: Grade I Tube type: Oral Number of attempts: 1 Airway Equipment and Method: Stylet Placement Confirmation: ETT inserted through vocal cords under direct vision,  positive ETCO2 and breath sounds checked- equal and bilateral Secured at: 21 cm Tube secured with: Tape Dental Injury: Teeth and Oropharynx as per pre-operative assessment

## 2014-11-08 NOTE — MAU Note (Signed)
Pt states her water broke around 0600 this a.m., was green at first, now more of a dark brown/reddish color.  Also having uc's.

## 2014-11-08 NOTE — Op Note (Signed)
Cesarean Section Procedure Note   Tracie BrookingDevonna Garcia Gonce   11/08/2014  Indications: Placental Abruption   Pre-operative Diagnosis: cesarean section for placental abruption.   Post-operative Diagnosis: Same   Emergency LTCS  Surgeon: Francoise CeoMarshall, Bernard A.  Assistants: Brock BadHarper, Charles A.  Anesthesia: General  Procedure Details:  The patient was seen in the Holding Room. The risks, benefits, complications, treatment options, and expected outcomes were discussed with the patient. The patient concurred with the proposed plan, giving informed consent. The patient was identified as Tracie Garcia Celmer and the procedure verified as C-Section Delivery. A Time Out was held and the above information confirmed.  After induction of anesthesia, the patient was draped and prepped in the usual sterile manner. A transverse incision was made and carried down through the subcutaneous tissue to the fascia. The fascial incision was made and extended transversely. The fascia was separated from the underlying rectus tissue superiorly and inferiorly. The peritoneum was identified and entered. The peritoneal incision was extended longitudinally. The utero-vesical peritoneal reflection was incised transversely and the bladder flap was bluntly freed from the lower uterine segment. A low transverse uterine incision was made. Delivered from cephalic presentation was a 2360 gram living newborn female infant(Garcia). APGAR (1 MIN): 2  APGAR (5 MINS): 6  APGAR (10 MINS):    A cord ph was sent = 6.79.  The umbilical cord was clamped and cut cord. A sample was obtained for evaluation. The placenta was removed Intact and appeared normal.  The uterine incision was closed with running locked sutures of 0 Monocryl. A second imbricating layer of the same suture was placed.  Hemostasis was observed.  The parieto peritoneum was closed in a running fashion with 2-0 Vicryl.  The fascia was then reapproximated with running sutures of 0 Vicryl.   The skin was closed with staples.  Instrument, sponge, and needle counts were correct prior the abdominal closure and were correct at the conclusion of the case.    Findings: Normal uterus, ovaries and tubes   Estimated Blood Loss: 700ml  Total IV Fluids: 1200ml   Urine Output: 50CC OF clear urine  Specimens: Placenta  Complications: no complications  Disposition: PACU - hemodynamically stable.  Maternal Condition: stable   Baby condition / location:  NICU    Signed: Charles A. Clearance CootsHarper MD

## 2014-11-08 NOTE — Transfer of Care (Signed)
Immediate Anesthesia Transfer of Care Note  Patient: Tracie Garcia  Procedure(s) Performed: Procedure(s): CESAREAN SECTION (N/A)  Patient Location: PACU  Anesthesia Type:General  Level of Consciousness: awake, alert , oriented and patient cooperative  Airway & Oxygen Therapy: Patient Spontanous Breathing and Patient connected to nasal cannula oxygen  Post-op Assessment: Report given to RN and Post -op Vital signs reviewed and stable  Post vital signs: Reviewed and stable  Last Vitals:  Filed Vitals:   11/08/14 1139  BP: 108/65  Pulse: 74  Temp:   Resp:     Complications: No apparent anesthesia complications

## 2014-11-08 NOTE — H&P (Signed)
Tracie Garcia is a 29 y.o. female presenting for ROM and UC's. Maternal Medical History:  Reason for admission: Rupture of membranes and contractions.   Contractions: Onset was 3-5 hours ago.    Fetal activity: Perceived fetal activity is normal.   Last perceived fetal movement was within the past hour.    Prenatal complications: no prenatal complications Prenatal Complications - Diabetes: none.    OB History    Gravida Para Term Preterm AB TAB SAB Ectopic Multiple Living   6 2 2  0 3 3 0 0 0 2     Past Medical History  Diagnosis Date  . Abnormal Pap smear   . Gonorrhea   . Chlamydia   . Headache   . Vaginal Pap smear, abnormal    Past Surgical History  Procedure Laterality Date  . Colposcopy    . Dilation and curettage of uterus      tab x2  . Therapeutic abortion     Family History: family history is negative for Alcohol abuse. She was adopted. Social History:  reports that she quit smoking about 7 months ago. Her smoking use included Cigarettes. She smoked 0.25 packs per day. She has never used smokeless tobacco. She reports that she does not drink alcohol or use illicit drugs.   Prenatal Transfer Tool  Maternal Diabetes: No Genetic Screening: Normal Maternal Ultrasounds/Referrals: Normal Fetal Ultrasounds or other Referrals:  None Maternal Substance Abuse:  No Significant Maternal Medications:  None Significant Maternal Lab Results:  Lab values include: Group B Strep positive Other Comments:  None  Review of Systems  All other systems reviewed and are negative.   Dilation: 1.5 Effacement (%): 80 Station: -2 Exam by:: Dorrene GermanJ. Lowe RN Blood pressure 115/53, pulse 91, temperature 97.3 F (36.3 C), temperature source Oral, resp. rate 18, last menstrual period 02/11/2014. Maternal Exam:  Abdomen: Patient reports no abdominal tenderness. Fetal presentation: vertex  Introitus: Ferning test: positive.  Amniotic fluid character: clear.  Pelvis: adequate for  delivery.   Cervix: Cervix evaluated by digital exam.     Physical Exam  Nursing note and vitals reviewed. Constitutional: She is oriented to person, place, and time. She appears well-developed and well-nourished.  HENT:  Head: Normocephalic and atraumatic.  Eyes: Conjunctivae are normal. Pupils are equal, round, and reactive to light.  Neck: Normal range of motion. Neck supple.  Cardiovascular: Normal rate and regular rhythm.   Respiratory: Effort normal and breath sounds normal.  GI: Soft. Bowel sounds are normal.  Genitourinary: Vagina normal and uterus normal.  Musculoskeletal: Normal range of motion.  Neurological: She is alert and oriented to person, place, and time.  Skin: Skin is warm and dry.  Psychiatric: She has a normal mood and affect. Her behavior is normal. Judgment and thought content normal.    Prenatal labs: ABO, Rh: O/POS/-- (04/05 1409) Antibody: NEG (04/05 1409) Rubella: 2.37 (04/05 1409) RPR: NON REAC (04/05 1409)  HBsAg: NEGATIVE (04/05 1409)  HIV: NONREACTIVE (04/05 1409)  GBS: Detected (09/29 1657)   Assessment/Plan: 38 weeks.  SROM and UC's.  Admit.  GBS prophylaxis.  Pitocin prn.   HARPER,CHARLES A 11/08/2014, 8:46 AM

## 2014-11-08 NOTE — Progress Notes (Signed)
Tracie Garcia is a 29 y.o. [id number] at 3150w4d by LMP admitted for rupture of membranes  Subjective:  Called for FHR down to 70 bpm with heavy vaginal bleeding and uterus hypertonic, without relaxation.  Clinical placental abruption suspected and OR alerted to prepare for emergency C/S.   Objective: BP 108/65 mmHg  Pulse 74  Temp(Src) 99.4 F (37.4 C) (Oral)  Resp 18  Ht 4\' 11"  (1.499 m)  Wt 158 lb (71.668 kg)  BMI 31.89 kg/m2  LMP 02/11/2014   Total I/O In: 1200 [I.V.:1200] Out: 750 [Urine:50; Blood:700]  FHT:  70 bpm UC:   regular, every 2-3 SVE:   Dilation: 1.5 Effacement (%): 80 Station: 0 Exam by:: denney, cnm  Labs: Lab Results  Component Value Date   WBC 9.4 11/08/2014   HGB 10.2* 11/08/2014   HCT 29.4* 11/08/2014   MCV 88.0 11/08/2014   PLT 231 11/08/2014    Assessment / Plan: 38 weeks.  Placental abruption.  To OR for emergency C/S for abruption of placenta.   Tracie Garcia A 11/08/2014, 1:22 PM

## 2014-11-08 NOTE — Lactation Note (Signed)
This note was copied from the chart of Tracie Laruth BouchardDevonna Hanford. Lactation Consultation Note  Patient Name: Tracie Garcia ONGEX'BToday's Date: 11/08/2014 Reason for consult: Initial assessment;NICU baby   , now 4 hours old, full term and small, weighing 5 lbs 3.3 oz. Mom had placental abruption, and baby had initial neonatal depression, now doing well, but on cooling blanket. Mom was very medicated , so I did not do pumping teaching with her. I did set up DEP, and left all lactation information for her. I did hand expression on mom, and was easily able to collect between 3-4 ml's of colostrum. Mom was advised to pump at least every 3hours. Teaching with lactation will need to be continued tomorrow, when mom is more awake.    Maternal Data Formula Feeding for Exclusion: Yes (baby in NICU) Reason for exclusion: Mother's choice to formula feed on admision Has patient been taught Hand Expression?: Yes Does the patient have breastfeeding experience prior to this delivery?: Yes  Feeding    LATCH Score/Interventions                      Lactation Tools Discussed/Used Pump Review: Setup, frequency, and cleaning;Milk Storage;Other (comment) (briefly reviewed with mom's sister - mom aleep, dosing on and off) Initiated by:: Neira Bentsen, rn IBCLC Date initiated:: 11/08/14 (hand expression done at 1730 - mom too medicated/sleepy to pump)   Consult Status Consult Status: Follow-up Date: 11/09/14 Follow-up type: In-patient    Alfred LevinsLee, Danylah Holden Anne 11/08/2014, 6:21 PM

## 2014-11-08 NOTE — Progress Notes (Signed)
X-ray at bs for post procedure x-ray.

## 2014-11-08 NOTE — Anesthesia Preprocedure Evaluation (Signed)
Anesthesia Evaluation  Patient identified by MRN, date of birth, ID band Patient awake    Reviewed: Allergy & Precautions, H&P , Patient's Chart, lab work & pertinent test results, Unable to perform ROS - Chart review only  History of Anesthesia Complications (+) history of anesthetic complications  Airway Mallampati: III  TM Distance: >3 FB Neck ROM: Full    Dental no notable dental hx. (+) Teeth Intact   Pulmonary neg pulmonary ROS, former smoker,    Pulmonary exam normal breath sounds clear to auscultation       Cardiovascular negative cardio ROS Normal cardiovascular exam Rhythm:Regular Rate:Normal     Neuro/Psych negative neurological ROS  negative psych ROS   GI/Hepatic negative GI ROS, Neg liver ROS, GERD  Medicated and Controlled,  Endo/Other  negative endocrine ROS  Renal/GU negative Renal ROS  negative genitourinary   Musculoskeletal   Abdominal   Peds  Hematology   Anesthesia Other Findings Probable placental abruption. Stat C/Section.  Reproductive/Obstetrics (+) Pregnancy                             Anesthesia Physical Anesthesia Plan  ASA: II and emergent  Anesthesia Plan: Spinal   Post-op Pain Management:    Induction:   Airway Management Planned:   Additional Equipment:   Intra-op Plan:   Post-operative Plan:   Informed Consent: I have reviewed the patients History and Physical, chart, labs and discussed the procedure including the risks, benefits and alternatives for the proposed anesthesia with the patient or authorized representative who has indicated his/her understanding and acceptance.   Dental Advisory Given  Plan Discussed with: Anesthesiologist, CRNA and Surgeon  Anesthesia Plan Comments:         Anesthesia Quick Evaluation

## 2014-11-08 NOTE — Anesthesia Postprocedure Evaluation (Signed)
  Anesthesia Post-op Note  Patient: Tracie Garcia  Procedure(s) Performed: Procedure(s): CESAREAN SECTION (N/A)  Patient Location: PACU  Anesthesia Type:General  Level of Consciousness: awake, alert  and oriented  Airway and Oxygen Therapy: Patient Spontanous Breathing and Patient connected to nasal cannula oxygen  Post-op Pain: mild  Post-op Assessment: Post-op Vital signs reviewed, Patient's Cardiovascular Status Stable, Respiratory Function Stable, Patent Airway, No signs of Nausea or vomiting and Pain level controlled              Post-op Vital Signs: Reviewed and stable  Last Vitals:  Filed Vitals:   11/08/14 1530  BP: 112/87  Pulse: 70  Temp: 36.4 C  Resp: 19    Complications: No apparent anesthesia complications

## 2014-11-09 ENCOUNTER — Encounter (HOSPITAL_COMMUNITY): Payer: Self-pay | Admitting: *Deleted

## 2014-11-09 LAB — CBC
HCT: 25.2 % — ABNORMAL LOW (ref 36.0–46.0)
Hemoglobin: 8.7 g/dL — ABNORMAL LOW (ref 12.0–15.0)
MCH: 30.5 pg (ref 26.0–34.0)
MCHC: 34.5 g/dL (ref 30.0–36.0)
MCV: 88.4 fL (ref 78.0–100.0)
Platelets: 205 10*3/uL (ref 150–400)
RBC: 2.85 MIL/uL — ABNORMAL LOW (ref 3.87–5.11)
RDW: 13.8 % (ref 11.5–15.5)
WBC: 11.8 10*3/uL — ABNORMAL HIGH (ref 4.0–10.5)

## 2014-11-09 NOTE — Lactation Note (Signed)
This note was copied from the chart of Tracie Laruth BouchardDevonna Schiffer. Lactation Consultation Note  Patient Name: Tracie Garcia GNFAO'ZToday's Date: 11/09/2014 Reason for consult: Follow-up assessment;NICU baby   Follow-up at 27 hrs with NICU mom.  GA 38.4; BW 5 lbs, 12.8 oz; Infant in NICU d/t placental abruption and on cooling blanket. Mom states infant is not taking PO feeds yet and states she has not been able to hold infant.  Anticipated discharge for mom on Monday, Nov 7 or Tues., Nov 8 (per mom). Mom was pumping when LC entered room.  Reviewed pumping on preemie setting with return demonstration using 3-4 teardrops.  Mom using #24 flanges.    Taught mom to use hands-on pumping method with hand expression at end of pumping session.  After 15 minutes of pumping on preemie setting mom hand expressed and collected more colostrum. Mom was able to express total of 3 ml colostrum in colostrum-collection container.   Reviewed NICU booklet.  Encouraged mom to keep pumping log and to pump at least 8 times per day (every 2 hrs during the day and at least once during the night). Mom has WIC.  Referral faxed.  Loaner paperwork explained and left at bedside with instructions.     Lactation Tools Discussed/Used WIC Program: Yes   Consult Status Consult Status: Follow-up Date: 11/11/14 Follow-up type: In-patient    Tracie Garcia, Tracie Garcia 11/09/2014, 4:54 PM

## 2014-11-09 NOTE — Plan of Care (Signed)
Problem: Life Cycle: Goal: Risk for postpartum hemorrhage will decrease Outcome: Completed/Met Date Met:  11/09/14 Fundus has remained U/1 and firm. Vaginal bleeding is small to scant Last hgb 8.7 VSS with a brady rate pulse that she tolerates well

## 2014-11-09 NOTE — Clinical Documentation Improvement (Signed)
OB/GYN  Abnormal Lab/Test Results:   Component Hemoglobin HCT  Latest Ref Rng 12.0 - 15.0 g/dL 81.136.0 - 91.446.0 %  78/2/956211/04/2014 10.2 (L) 29.4 (L)  11/09/2014 8.7 (L) 25.2 (L)    Possible Clinical Conditions associated with below indicators  Acute blood loss anemia  Acute blood loss anemia on chronic anemia (please specify type)  Anemia, other (please specify)   Other Condition  Cannot Clinically Determine   Supporting Information: Surgical EBL 700cc per operative note.     Please exercise your independent, professional judgment when responding. A specific answer is not anticipated or expected.   Thank Vivien RossettiYou,  Jenni Thew R Shayda Kalka [employer] [place] [phone]

## 2014-11-09 NOTE — Addendum Note (Signed)
Addendum  created 11/09/14 0754 by Angela Adamana G Terrance Usery, CRNA   Modules edited: Notes Section   Notes Section:  File: [id number]

## 2014-11-09 NOTE — Progress Notes (Signed)
Patient ID: Tracie BrookingDevonna S Garcia, female   DOB: 1985-02-23, 29 y.o.   MRN: [id number] Postop day 1 Blood pressure 129/78 respiration 19 pulse 91 afebrile Abdomen soft dressing dry Lochia moderate Legs negative doing well

## 2014-11-09 NOTE — Anesthesia Postprocedure Evaluation (Signed)
  Anesthesia Post-op Note  Patient: Tracie Garcia  Procedure(s) Performed: Procedure(s): CESAREAN SECTION (N/A)  Patient Location: Mother/Baby  Anesthesia Type:General  Level of Consciousness: awake and alert   Airway and Oxygen Therapy: Patient Spontanous Breathing  Post-op Pain: mild  Post-op Assessment: Post-op Vital signs reviewed, Patient's Cardiovascular Status Stable, Respiratory Function Stable, No signs of Nausea or vomiting, Pain level controlled and No headache              Post-op Vital Signs: Reviewed  Last Vitals:  Filed Vitals:   11/09/14 0556  BP: 110/65  Pulse: 61  Temp: 36.7 C  Resp: 16    Complications: No apparent anesthesia complications
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
## 2014-11-10 NOTE — Progress Notes (Signed)
Patient ID: Tracie Garcia, female   DOB: 07/29/1985, 29 y.o.   MRN: [id number] Postop day 2 Blood pressure 116/66 pulse 63 respiration 16 afebrile Abdomen soft Dressing dry Legs negative doing well

## 2014-11-10 NOTE — Plan of Care (Signed)
Problem: Life Cycle: Goal: Chance of risk for complications during the postpartum period will decrease Outcome: Completed/Met Date Met:  11/10/14 Verbalizes that she understands what is important to inform her Doctor of.

## 2014-11-10 NOTE — Lactation Note (Signed)
This note was copied from the chart of Tracie Laruth BouchardDevonna Gierke. Lactation Consultation Note  Patient Name: Tracie Garcia ZOXWR'UToday's Date: 11/10/2014   Visited with Mom, baby 10646 hrs old in NICU.  Mom states she is pumping every 2 hrs, and obtaining up to 30 ml of colostrum to transport to NICU.  Reviewed some basics, and answered some general questions.  [facility] made by FAX for pump at discharge.  Mom has paperwork for pump rental/loaner also.   Follow up 11/11/14  Call for assistance prn.  Tracie Garcia, Tracie Garcia 11/10/2014, 11:14 AM
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
## 2014-11-11 ENCOUNTER — Encounter (HOSPITAL_COMMUNITY): Payer: Self-pay | Admitting: Obstetrics

## 2014-11-11 MED ORDER — OXYCODONE-ACETAMINOPHEN 5-325 MG PO TABS
1.0000 | ORAL_TABLET | ORAL | Status: DC | PRN
Start: 2014-11-11 — End: 2017-07-13

## 2014-11-11 MED ORDER — FUSION PLUS PO CAPS: 1.0000 | ORAL_CAPSULE | Freq: Every day | ORAL | Status: DC

## 2014-11-11 MED ORDER — IBUPROFEN 800 MG PO TABS: 800.0000 mg | ORAL_TABLET | Freq: Three times a day (TID) | ORAL | Status: DC | PRN

## 2014-11-11 NOTE — Discharge Summary (Signed)
Obstetric Discharge Summary Reason for Admission: rupture of membranes Prenatal Procedures: ultrasound Intrapartum Procedures: cesarean: low cervical, transverse Postpartum Procedures: none Complications-Operative and Postpartum: none HEMOGLOBIN  Date Value Ref Range Status  11/09/2014 8.7* 12.0 - 15.0 g/dL Final   HCT  Date Value Ref Range Status  11/09/2014 25.2* 36.0 - 46.0 % Final    Physical Exam:  General: alert and no distress Lochia: appropriate Uterine Fundus: firm Incision: healing well DVT Evaluation: No evidence of DVT seen on physical exam.  Discharge Diagnoses: Term Pregnancy-delivered and Placental Abruption  Discharge Information: Date: 11/11/2014 Activity: pelvic rest Diet: routine Medications: PNV, Ibuprofen, Colace, Iron and Percocet Condition: stable Instructions: refer to practice specific booklet Discharge to: home Follow-up Information    Follow up with HARPER,CHARLES A, MD In 3 days.   Specialty:  Obstetrics and Gynecology   Why:  Removal of stalpes   Contact information:   [address] [place] [address] [phone]       Newborn Data: Live born female  Birth Weight: 5 lb 12.8 oz (2630 g) APGAR: 2, 4  Newborn in NICU.  HARPER,CHARLES A 11/11/2014, 8:12 AM

## 2014-11-11 NOTE — Lactation Note (Signed)
This note was copied from the chart of Tracie Laruth BouchardDevonna Loe. Lactation Consultation Note  Patient Name: Tracie Laruth BouchardDevonna Azad AOZHY'QToday's Date: 11/11/2014 Reason for consult: Follow-up assessment    With this mom of a NICU baby, now 5869 hours old, and full term. The baby should be weaning off the cooling blanket this evening, as per mom. Mom is pumping up to 6 ounces of milk at a time, and [facility] Mom and baby will be followed in NICU.   Maternal Data    Feeding    LATCH Score/Interventions                      Lactation Tools Discussed/Used [facility]IC Program: Yes [facility]30 to [facility])   Consult Status Consult Status: PRN Follow-up type: In-patient (NICU)    Alfred LevinsLee, Milanna Kozlov Anne 11/11/2014, 11:15 AM

## 2014-11-11 NOTE — Progress Notes (Signed)
Subjective: Postpartum Day 3: Cesarean Delivery Patient reports tolerating PO, + flatus, + BM and no problems voiding.    Objective: Vital signs in last 24 hours: Temp:  [98.2 F (36.8 C)-98.4 F (36.9 C)] 98.2 F (36.8 C) (11/07 0559) Pulse Rate:  [55-74] 57 (11/07 0559) Resp:  [16-18] 16 (11/07 0559) BP: (107-109)/(59-70) 109/63 mmHg (11/07 0559) SpO2:  [99 %-100 %] 100 % (11/07 0559)  Physical Exam:  General: alert and no distress Lochia: appropriate Uterine Fundus: firm Incision: healing well DVT Evaluation: No evidence of DVT seen on physical exam.   Recent Labs  11/08/14 0900 11/09/14 0520  HGB 10.2* 8.7*  HCT 29.4* 25.2*    Assessment/Plan: Anemia from acute blood loss from placental abruption.  Stable clinically.  Iron Rx. Status post Cesarean section. Doing well postoperatively.  Discharge home with standard precautions and return to clinic in 2 weeks.  Benisha Hadaway A 11/11/2014, 8:05 AM

## 2014-11-11 NOTE — Progress Notes (Signed)
Pt is discharged in the care of friend, with N.T. Escort.Denies any pain or discomfort., Infant to remain in NICU. Abdominal honeycomb dressing is clean and  Dry. Spirits are good.  All discharged instructions were given with good understanding. Stable.

## 2014-11-13 ENCOUNTER — Encounter: Payer: Medicaid Other | Admitting: Obstetrics

## 2014-11-13 ENCOUNTER — Encounter: Payer: Self-pay | Admitting: Obstetrics

## 2014-11-13 ENCOUNTER — Ambulatory Visit (INDEPENDENT_AMBULATORY_CARE_PROVIDER_SITE_OTHER): Payer: Medicaid Other | Admitting: Obstetrics

## 2014-11-13 DIAGNOSIS — Z9889 Other specified postprocedural states: Secondary | ICD-10-CM

## 2014-11-13 DIAGNOSIS — E02 Subclinical iodine-deficiency hypothyroidism: Secondary | ICD-10-CM

## 2014-11-13 MED ORDER — LEVOTHYROXINE SODIUM 75 MCG PO TABS: 75.0000 ug | ORAL_TABLET | Freq: Every day | ORAL | Status: DC

## 2014-11-13 NOTE — Progress Notes (Signed)
Postpartum S:  Presents for removal of staples 5 days post op LTCS.  No complaints. O:  Afebrile, VSS      General:  Alert and no distress      Breasts:  Soft.  No masses, tenderness.      Abdomen:  Soft, NT.  Incision C,D, I.                            Staples removed and steri strips applied.      Extremities:  No C, C, E. A:  Doing well. P:  F/U in 2 weeks.  Coral Ceoharles Jashay Roddy MD 11-13-2014
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
## 2014-11-21 ENCOUNTER — Ambulatory Visit: Payer: Self-pay

## 2014-11-21 NOTE — Lactation Note (Signed)
This note was copied from the chart of Tracie Garcia Edell. Lactation Consultation Note  Patient Name: Tracie Garcia Kelsay WGNFA'OToday's Date: 11/21/2014 Reason for consult: Follow-up assessment;NICU baby NICU baby 7613 days old. Mom roomed in with baby the previous night and reports that baby is nursing at each feeding. Mom states that she has a good supply of milk and understands that if baby not nursing well she will need to supplement with her EBM. Mom states that baby usually nurses both breasts at a feeding. Baby asleep on mom's chest and appears satisfied. Discussed ways of knowing baby getting enough milk at breast. Mom states that she nursed her first child as well. Mom aware of OP/BFSG and LC phone line assistance after D/C.  Maternal Data    Feeding    LATCH Score/Interventions                      Lactation Tools Discussed/Used     Consult Status Consult Status: PRN    Geralynn OchsWILLIARD, Giorgia Wahler 11/21/2014, 9:11 AM

## 2014-11-25 ENCOUNTER — Ambulatory Visit (INDEPENDENT_AMBULATORY_CARE_PROVIDER_SITE_OTHER): Payer: Medicaid Other | Admitting: Obstetrics

## 2014-11-25 ENCOUNTER — Encounter (HOSPITAL_COMMUNITY): Payer: Self-pay | Admitting: *Deleted

## 2014-11-25 ENCOUNTER — Inpatient Hospital Stay (HOSPITAL_COMMUNITY)
Admission: AD | Admit: 2014-11-25 | Discharge: 2014-11-27 | DRG: 776 | Disposition: A | Discharge status: Home / Self Care | Payer: Medicaid Other | Source: Ambulatory Visit | Attending: Obstetrics | Admitting: Obstetrics

## 2014-11-25 DIAGNOSIS — O165 Unspecified maternal hypertension, complicating the puerperium: Secondary | ICD-10-CM

## 2014-11-25 DIAGNOSIS — R030 Elevated blood-pressure reading, without diagnosis of hypertension: Secondary | ICD-10-CM | POA: Diagnosis present

## 2014-11-25 DIAGNOSIS — O1495 Unspecified pre-eclampsia, complicating the puerperium: Secondary | ICD-10-CM | POA: Diagnosis present

## 2014-11-25 DIAGNOSIS — Z87891 Personal history of nicotine dependence: Secondary | ICD-10-CM | POA: Diagnosis not present

## 2014-11-25 LAB — URINE MICROSCOPIC-ADD ON

## 2014-11-25 LAB — CBC WITH DIFFERENTIAL/PLATELET
Basophils Absolute: 0 K/uL (ref 0.0–0.1)
Basophils Relative: 1 %
Eosinophils Absolute: 0.1 K/uL (ref 0.0–0.7)
Eosinophils Relative: 2 %
HCT: 27.6 % — ABNORMAL LOW (ref 36.0–46.0)
Hemoglobin: 9.4 g/dL — ABNORMAL LOW (ref 12.0–15.0)
Lymphocytes Relative: 38 %
Lymphs Abs: 2.4 K/uL (ref 0.7–4.0)
MCH: 30 pg (ref 26.0–34.0)
MCHC: 34.1 g/dL (ref 30.0–36.0)
MCV: 88.2 fL (ref 78.0–100.0)
Monocytes Absolute: 0.4 K/uL (ref 0.1–1.0)
Monocytes Relative: 6 %
Neutro Abs: 3.5 K/uL (ref 1.7–7.7)
Neutrophils Relative %: 53 %
Platelets: 323 K/uL (ref 150–400)
RBC: 3.13 MIL/uL — ABNORMAL LOW (ref 3.87–5.11)
RDW: 13.7 % (ref 11.5–15.5)
WBC: 6.4 K/uL (ref 4.0–10.5)

## 2014-11-25 LAB — URINALYSIS, ROUTINE W REFLEX MICROSCOPIC
BILIRUBIN URINE: NEGATIVE
GLUCOSE, UA: NEGATIVE mg/dL
Ketones, ur: NEGATIVE mg/dL
Nitrite: NEGATIVE
PROTEIN: NEGATIVE mg/dL
Specific Gravity, Urine: 1.02 (ref 1.005–1.030)
pH: 6 (ref 5.0–8.0)

## 2014-11-25 LAB — COMPREHENSIVE METABOLIC PANEL
ALT: 14 U/L (ref 14–54)
ANION GAP: 8 (ref 5–15)
AST: 16 U/L (ref 15–41)
Albumin: 3.5 g/dL (ref 3.5–5.0)
Alkaline Phosphatase: 101 U/L (ref 38–126)
BILIRUBIN TOTAL: 0.8 mg/dL (ref 0.3–1.2)
BUN: <5 mg/dL — ABNORMAL LOW (ref 6–20)
CO2: 24 mmol/L (ref 22–32)
Calcium: 8.7 mg/dL — ABNORMAL LOW (ref 8.9–10.3)
Chloride: 109 mmol/L (ref 101–111)
Creatinine, Ser: 0.74 mg/dL (ref 0.44–1.00)
Glucose, Bld: 82 mg/dL (ref 65–99)
POTASSIUM: 3.1 mmol/L — AB (ref 3.5–5.1)
Sodium: 141 mmol/L (ref 135–145)
TOTAL PROTEIN: 6.5 g/dL (ref 6.5–8.1)

## 2014-11-25 LAB — TYPE AND SCREEN
ABO/RH(D): O POS
Antibody Screen: NEGATIVE

## 2014-11-25 LAB — PROTEIN / CREATININE RATIO, URINE
CREATININE, URINE: 99 mg/dL
PROTEIN CREATININE RATIO: 0.11 mg/mg{creat} (ref 0.00–0.15)
Total Protein, Urine: 11 mg/dL

## 2014-11-25 MED ORDER — HYDRALAZINE HCL 20 MG/ML IJ SOLN
10.0000 mg | Freq: Once | INTRAMUSCULAR | Status: AC | PRN
  Administered 2014-11-25: 10 mg via INTRAVENOUS
  Filled 2014-11-25: qty 1

## 2014-11-25 MED ORDER — ONDANSETRON HCL 4 MG/2ML IJ SOLN
4.0000 mg | Freq: Once | INTRAMUSCULAR | Status: AC
  Administered 2014-11-25: 4 mg via INTRAVENOUS
  Filled 2014-11-25: qty 2

## 2014-11-25 MED ORDER — MAGNESIUM SULFATE 50 % IJ SOLN
2.0000 g/h | INTRAVENOUS | Status: DC
  Administered 2014-11-25: 4 g/h via INTRAVENOUS
  Administered 2014-11-26: 2 g/h via INTRAVENOUS
  Filled 2014-11-25 (×2): qty 80

## 2014-11-25 MED ORDER — LACTATED RINGERS IV SOLN
INTRAVENOUS | Status: DC
  Administered 2014-11-25 – 2014-11-26 (×4): via INTRAVENOUS

## 2014-11-25 MED ORDER — LABETALOL HCL 5 MG/ML IV SOLN
20.0000 mg | INTRAVENOUS | Status: AC | PRN
  Administered 2014-11-25: 80 mg via INTRAVENOUS
  Administered 2014-11-25: 20 mg via INTRAVENOUS
  Administered 2014-11-25: 40 mg via INTRAVENOUS
  Filled 2014-11-25: qty 8
  Filled 2014-11-25: qty 4
  Filled 2014-11-25: qty 16

## 2014-11-25 MED ORDER — CALCIUM CARBONATE ANTACID 500 MG PO CHEW: 2.0000 | CHEWABLE_TABLET | ORAL | Status: DC | PRN

## 2014-11-25 MED ORDER — PRENATAL MULTIVITAMIN CH
1.0000 | ORAL_TABLET | Freq: Every day | ORAL | Status: DC
  Administered 2014-11-26 – 2014-11-27 (×2): 1 via ORAL
  Filled 2014-11-25 (×2): qty 1

## 2014-11-25 MED ORDER — HYDROMORPHONE HCL 1 MG/ML IJ SOLN
INTRAMUSCULAR | Status: AC
  Administered 2014-11-25: 1 mg
  Filled 2014-11-25: qty 1

## 2014-11-25 MED ORDER — MAGNESIUM SULFATE BOLUS VIA INFUSION
4.0000 g | Freq: Once | INTRAVENOUS | Status: DC
  Filled 2014-11-25: qty 500

## 2014-11-25 MED ORDER — DOCUSATE SODIUM 100 MG PO CAPS
100.0000 mg | ORAL_CAPSULE | Freq: Every day | ORAL | Status: DC
  Administered 2014-11-26 – 2014-11-27 (×2): 100 mg via ORAL
  Filled 2014-11-25 (×2): qty 1

## 2014-11-25 MED ORDER — ZOLPIDEM TARTRATE 5 MG PO TABS: 5.0000 mg | ORAL_TABLET | Freq: Every evening | ORAL | Status: DC | PRN

## 2014-11-25 MED ORDER — LABETALOL HCL 100 MG PO TABS
200.0000 mg | ORAL_TABLET | Freq: Three times a day (TID) | ORAL | Status: DC
  Administered 2014-11-26 – 2014-11-27 (×6): 200 mg via ORAL
  Filled 2014-11-25 (×6): qty 2

## 2014-11-25 MED ORDER — ACETAMINOPHEN 325 MG PO TABS
650.0000 mg | ORAL_TABLET | ORAL | Status: DC | PRN
  Administered 2014-11-25 – 2014-11-26 (×2): 650 mg via ORAL
  Filled 2014-11-25 (×2): qty 2

## 2014-11-25 MED ORDER — HYDROMORPHONE HCL 1 MG/ML IJ SOLN
1.0000 mg | Freq: Once | INTRAMUSCULAR | Status: DC
  Filled 2014-11-25: qty 1

## 2014-11-25 NOTE — MAU Provider Note (Signed)
History     CSN: [id number]  Arrival date and time: 11/25/14 1520   None     Chief Complaint  Patient presents with  . Hypertension   HPI Tracie Garcia is 29 y.o. [id number] presents after being seen in Dr. Verdell Carmine office for post partum check to find elevated blood pressures 192/110.  Pre esclampsia protocol was begun on admission.   BPs are elevated.  Headache began earlier today.  Rating it as a 9/10.  She has not taken any meds.  Denies visual changes, chest pain today.  States pregnancy was without complications until placenta abrupted at [redacted] wks gestation.  Had emergency C- Sect 11/4.  Post partum course was difficult with infant in nursery but things got better once he was discharged on last Thursday.      Past Medical History  Diagnosis Date  . Abnormal Pap smear   . Gonorrhea   . Chlamydia   . Headache   . Vaginal Pap smear, abnormal     Past Surgical History  Procedure Laterality Date  . Colposcopy    . Dilation and curettage of uterus      tab x2  . Therapeutic abortion    . Cesarean section N/A 11/08/2014    Procedure: CESAREAN SECTION;  Surgeon: Kathreen Cosier, MD;  Location: [facility];  Service: Obstetrics;  Laterality: N/A;    Family History  Problem Relation Age of Onset  . Adopted: Yes  . Alcohol abuse Neg Hx     Social History  Substance Use Topics  . Smoking status: Former Smoker -- 0.25 packs/day    Types: Cigarettes    Quit date: 03/11/2014  . Smokeless tobacco: Never Used  . Alcohol Use: No    Allergies:  Allergies  Allergen Reactions  . Monistat [Miconazole] Swelling    Prescriptions prior to admission  Medication Sig Dispense Refill Last Dose  . docusate sodium (COLACE) 100 MG capsule Take 100 mg by mouth 2 (two) times daily.   11/24/2014 at Unknown time  . ibuprofen (ADVIL,MOTRIN) 800 MG tablet Take 1 tablet (800 mg total) by mouth every 8 (eight) hours as needed for mild pain. 30 tablet 5 Past Month at Unknown time  .  Iron-FA-B Cmp-C-Biot-Probiotic (FUSION PLUS) CAPS Take 1 capsule by mouth daily before breakfast. 30 capsule 5 11/25/2014 at Unknown time  . oxyCODONE-acetaminophen (PERCOCET/ROXICET) 5-325 MG tablet Take 1-2 tablets by mouth every 4 (four) hours as needed for severe pain (once tolerating food). 40 tablet 0 Past Month at Unknown time  . prenatal vitamin w/FE, FA (PRENATAL 1 + 1) 27-1 MG TABS tablet Take 1 tablet by mouth daily at 12 noon.   11/24/2014 at Unknown time  . levothyroxine (SYNTHROID, LEVOTHROID) 75 MCG tablet Take 1 tablet (75 mcg total) by mouth daily. (Patient not taking: Reported on 11/25/2014) 30 tablet 11   . zolpidem (AMBIEN) 5 MG tablet Take 1 tablet (5 mg total) by mouth at bedtime as needed for sleep. (Patient not taking: Reported on 11/25/2014) 30 tablet 2 Not Taking at Unknown time    Review of Systems  Constitutional: Negative for fever and chills.  Eyes: Negative for blurred vision and double vision.  Cardiovascular: Negative for chest pain.  Gastrointestinal: Negative for nausea, vomiting and abdominal pain.  Genitourinary: Negative for dysuria, urgency, frequency and hematuria.  Neurological: Positive for headaches. Negative for dizziness and tingling.   Physical Exam   Blood pressure 188/99, pulse 58, temperature 98 F (36.7 C), temperature source  Oral, resp. rate 16, height  (1.499 m), weight 150 lb (68.04 kg), unknown if currently breastfeeding.  Physical Exam  Nursing note and vitals reviewed. Constitutional: She is oriented to person, place, and time. She appears well-developed and well-nourished. No distress.  HENT:  Head: Normocephalic.  Neck: Normal range of motion.  Cardiovascular: Regular rhythm and normal heart sounds.   Respiratory: Effort normal and breath sounds normal. No respiratory distress.  GI: Soft. She exhibits no distension. There is no tenderness.  Neurological: She is alert and oriented to person, place, and time. She has normal  reflexes.  Neg for clonus  Skin: Skin is warm and dry.  Psychiatric: She has a normal mood and affect. Her behavior is normal. Thought content normal.    Results for orders placed or performed during the hospital encounter of 11/25/14 (from the past 24 hour(s))  CBC with Differential/Platelet     Status: Abnormal   Collection Time: 11/25/14  4:10 PM  Result Value Ref Range   WBC 6.4 4.0 - 10.5 K/uL   RBC 3.13 (L) 3.87 - 5.11 MIL/uL   Hemoglobin 9.4 (L) 12.0 - 15.0 g/dL   HCT 16.1 (L) 09.6 - 04.5 %   MCV 88.2 78.0 - 100.0 fL   MCH 30.0 26.0 - 34.0 pg   MCHC 34.1 30.0 - 36.0 g/dL   RDW 40.9 81.1 - 91.4 %   Platelets 323 150 - 400 K/uL   Neutrophils Relative % 53 %   Neutro Abs 3.5 1.7 - 7.7 K/uL   Lymphocytes Relative 38 %   Lymphs Abs 2.4 0.7 - 4.0 K/uL   Monocytes Relative 6 %   Monocytes Absolute 0.4 0.1 - 1.0 K/uL   Eosinophils Relative 2 %   Eosinophils Absolute 0.1 0.0 - 0.7 K/uL   Basophils Relative 1 %   Basophils Absolute 0.0 0.0 - 0.1 K/uL  Comprehensive metabolic panel     Status: Abnormal   Collection Time: 11/25/14  4:10 PM  Result Value Ref Range   Sodium 141 135 - 145 mmol/L   Potassium 3.1 (L) 3.5 - 5.1 mmol/L   Chloride 109 101 - 111 mmol/L   CO2 24 22 - 32 mmol/L   Glucose, Bld 82 65 - 99 mg/dL   BUN <5 (L) 6 - 20 mg/dL   Creatinine, Ser 7.82 0.44 - 1.00 mg/dL   Calcium 8.7 (L) 8.9 - 10.3 mg/dL   Total Protein 6.5 6.5 - 8.1 g/dL   Albumin 3.5 3.5 - 5.0 g/dL   AST 16 15 - 41 U/L   ALT 14 14 - 54 U/L   Alkaline Phosphatase 101 38 - 126 U/L   Total Bilirubin 0.8 0.3 - 1.2 mg/dL   GFR calc non Af Amer >60 >60 mL/min   GFR calc Af Amer >60 >60 mL/min   Anion gap 8 5 - 15    Patient Vitals for the past 24 hrs:  BP Temp Temp src Pulse Resp Height Weight  11/25/14 1739 188/99 mmHg - - (!) 58 - - -  11/25/14 1731 (!) 172/108 mmHg - - 86 16 - -  11/25/14 1715 159/85 mmHg - - 69 16 - -  11/25/14 1701 155/81 mmHg - - 75 16 - -  11/25/14 1645 (!) 161/128  mmHg - - 61 16 - -  11/25/14 1636 174/93 mmHg - - (!) 53 16 - -  11/25/14 1616 171/96 mmHg - - (!) 58 16 - -  11/25/14 1601 (!) 177/101  mmHg - - (!) 58 16 - -  11/25/14 1544 176/85 mmHg - - (!) 53 16 - -  11/25/14 1532 170/88 mmHg 98 F (36.7 C) Oral (!) 53 16 - -  11/25/14 1530 - - - - - 4\' 11"  (1.499 m) 150 lb (68.04 kg)   MAU Course  Procedures  MDM MSE Reported to Dr. Clearance CootsHarper.  Orders for pre esclampsia protocol and Dilaudid 1mg  IV for headache Labs IV started Meds  Given in MAu--Labetalol protocol                                   Diluadid 1mg  IV                                   Zofran 4mg  IV                                   Hydrozaline 10mg  IV  Reported to Dr. Clearance CootsHarper that BPs were going back up after 2 doses of Labetalol.  Dr. Clearance CootsHarper gave Orders given to ADMIT patient RN aware that urine has not been collected for Protein/Creat. Ratio--she will collect  Assessment and Plan  A:  Post Partum Preeclampsia -- PP Day 17  P: ADMIT  KEY,EVE M 11/25/2014, 5:55 PM

## 2014-11-25 NOTE — H&P (Signed)
History     CSN: [id number]  Arrival date and time: 11/25/14 1520   None     Chief Complaint  Patient presents with  . Hypertension   HPI Tracie Garcia is 29 y.o. [id number] presents after being seen in Dr. Verdell Carmine office for post partum check to find elevated blood pressures 192/110.  Pre esclampsia protocol was begun on admission.   BPs are elevated.  Headache began earlier today.  Rating it as a 9/10.  She has not taken any meds.  Denies visual changes, chest pain today.  States pregnancy was without complications until placenta abrupted at [redacted] wks gestation.  Had emergency C- Sect 11/4.  Post partum course was difficult with infant in nursery but things got better once he was discharged on last Thursday.      Past Medical History  Diagnosis Date  . Abnormal Pap smear   . Gonorrhea   . Chlamydia   . Headache   . Vaginal Pap smear, abnormal     Past Surgical History  Procedure Laterality Date  . Colposcopy    . Dilation and curettage of uterus      tab x2  . Therapeutic abortion    . Cesarean section N/A 11/08/2014    Procedure: CESAREAN SECTION;  Surgeon: Kathreen Cosier, MD;  Location: [facility];  Service: Obstetrics;  Laterality: N/A;    Family History  Problem Relation Age of Onset  . Adopted: Yes  . Alcohol abuse Neg Hx     Social History  Substance Use Topics  . Smoking status: Former Smoker -- 0.25 packs/day    Types: Cigarettes    Quit date: 03/11/2014  . Smokeless tobacco: Never Used  . Alcohol Use: No    Allergies:  Allergies  Allergen Reactions  . Monistat [Miconazole] Swelling    Prescriptions prior to admission  Medication Sig Dispense Refill Last Dose  . docusate sodium (COLACE) 100 MG capsule Take 100 mg by mouth 2 (two) times daily.   11/24/2014 at Unknown time  . ibuprofen (ADVIL,MOTRIN) 800 MG tablet Take 1 tablet (800 mg total) by mouth every 8 (eight) hours as needed for mild pain. 30 tablet 5 Past Month at Unknown time  .  Iron-FA-B Cmp-C-Biot-Probiotic (FUSION PLUS) CAPS Take 1 capsule by mouth daily before breakfast. 30 capsule 5 11/25/2014 at Unknown time  . oxyCODONE-acetaminophen (PERCOCET/ROXICET) 5-325 MG tablet Take 1-2 tablets by mouth every 4 (four) hours as needed for severe pain (once tolerating food). 40 tablet 0 Past Month at Unknown time  . prenatal vitamin w/FE, FA (PRENATAL 1 + 1) 27-1 MG TABS tablet Take 1 tablet by mouth daily at 12 noon.   11/24/2014 at Unknown time  . levothyroxine (SYNTHROID, LEVOTHROID) 75 MCG tablet Take 1 tablet (75 mcg total) by mouth daily. (Patient not taking: Reported on 11/25/2014) 30 tablet 11   . zolpidem (AMBIEN) 5 MG tablet Take 1 tablet (5 mg total) by mouth at bedtime as needed for sleep. (Patient not taking: Reported on 11/25/2014) 30 tablet 2 Not Taking at Unknown time    Review of Systems  Constitutional: Negative for fever and chills.  Eyes: Negative for blurred vision and double vision.  Cardiovascular: Negative for chest pain.  Gastrointestinal: Negative for nausea, vomiting and abdominal pain.  Genitourinary: Negative for dysuria, urgency, frequency and hematuria.  Neurological: Positive for headaches. Negative for dizziness and tingling.   Physical Exam   Blood pressure 188/99, pulse 58, temperature 98 F (36.7 C), temperature source  Oral, resp. rate 16, height  (1.499 m), weight 150 lb (68.04 kg), unknown if currently breastfeeding.  Physical Exam  Nursing note and vitals reviewed. Constitutional: She is oriented to person, place, and time. She appears well-developed and well-nourished. No distress.  HENT:  Head: Normocephalic.  Neck: Normal range of motion.  Cardiovascular: Regular rhythm and normal heart sounds.   Respiratory: Effort normal and breath sounds normal. No respiratory distress.  GI: Soft. She exhibits no distension. There is no tenderness.  Neurological: She is alert and oriented to person, place, and time. She has normal  reflexes.  Neg for clonus  Skin: Skin is warm and dry.  Psychiatric: She has a normal mood and affect. Her behavior is normal. Thought content normal.    Results for orders placed or performed during the hospital encounter of 11/25/14 (from the past 24 hour(s))  CBC with Differential/Platelet     Status: Abnormal   Collection Time: 11/25/14  4:10 PM  Result Value Ref Range   WBC 6.4 4.0 - 10.5 K/uL   RBC 3.13 (L) 3.87 - 5.11 MIL/uL   Hemoglobin 9.4 (L) 12.0 - 15.0 g/dL   HCT 16.1 (L) 09.6 - 04.5 %   MCV 88.2 78.0 - 100.0 fL   MCH 30.0 26.0 - 34.0 pg   MCHC 34.1 30.0 - 36.0 g/dL   RDW 40.9 81.1 - 91.4 %   Platelets 323 150 - 400 K/uL   Neutrophils Relative % 53 %   Neutro Abs 3.5 1.7 - 7.7 K/uL   Lymphocytes Relative 38 %   Lymphs Abs 2.4 0.7 - 4.0 K/uL   Monocytes Relative 6 %   Monocytes Absolute 0.4 0.1 - 1.0 K/uL   Eosinophils Relative 2 %   Eosinophils Absolute 0.1 0.0 - 0.7 K/uL   Basophils Relative 1 %   Basophils Absolute 0.0 0.0 - 0.1 K/uL  Comprehensive metabolic panel     Status: Abnormal   Collection Time: 11/25/14  4:10 PM  Result Value Ref Range   Sodium 141 135 - 145 mmol/L   Potassium 3.1 (L) 3.5 - 5.1 mmol/L   Chloride 109 101 - 111 mmol/L   CO2 24 22 - 32 mmol/L   Glucose, Bld 82 65 - 99 mg/dL   BUN <5 (L) 6 - 20 mg/dL   Creatinine, Ser 7.82 0.44 - 1.00 mg/dL   Calcium 8.7 (L) 8.9 - 10.3 mg/dL   Total Protein 6.5 6.5 - 8.1 g/dL   Albumin 3.5 3.5 - 5.0 g/dL   AST 16 15 - 41 U/L   ALT 14 14 - 54 U/L   Alkaline Phosphatase 101 38 - 126 U/L   Total Bilirubin 0.8 0.3 - 1.2 mg/dL   GFR calc non Af Amer >60 >60 mL/min   GFR calc Af Amer >60 >60 mL/min   Anion gap 8 5 - 15    Patient Vitals for the past 24 hrs:  BP Temp Temp src Pulse Resp Height Weight  11/25/14 1739 188/99 mmHg - - (!) 58 - - -  11/25/14 1731 (!) 172/108 mmHg - - 86 16 - -  11/25/14 1715 159/85 mmHg - - 69 16 - -  11/25/14 1701 155/81 mmHg - - 75 16 - -  11/25/14 1645 (!) 161/128  mmHg - - 61 16 - -  11/25/14 1636 174/93 mmHg - - (!) 53 16 - -  11/25/14 1616 171/96 mmHg - - (!) 58 16 - -  11/25/14 1601 (!) 177/101  mmHg - - (!) 58 16 - -  11/25/14 1544 176/85 mmHg - - (!) 53 16 - -  11/25/14 1532 170/88 mmHg 98 F (36.7 C) Oral (!) 53 16 - -  11/25/14 1530 - - - - - 4\' 11"  (1.499 m) 150 lb (68.04 kg)   MAU Course  Procedures  MDM MSE Reported to Dr. Clearance CootsHarper.  Orders for pre esclampsia protocol and Dilaudid 1mg  IV for headache Labs IV started Meds  Given in MAu--Labetalol protocol                                   Diluadid 1mg  IV                                   Zofran 4mg  IV                                   Hydrozaline 10mg  IV  RN aware that urine has not been collected for Protein/Creat. Ratio--she will collect  Assessment and Plan  A:  Post Partum Preeclampsia -- PP Day 17  P: ADMIT  Charles A. Clearance CootsHarper MD 11/25/2014, 5:55 PM

## 2014-11-25 NOTE — Progress Notes (Signed)
Patient in the office today for a postpartum visit. Patient has complaints of headaches and dizziness. Per provider due to patient's blood pressure and current complaints patient was sent to MAU for further evaluation.

## 2014-11-25 NOTE — Progress Notes (Signed)
Breast pump given to pt to help express milk.  Pt uncomfortable and feels she needs to express her milk Feels better after pumping.

## 2014-11-26 ENCOUNTER — Encounter (HOSPITAL_COMMUNITY): Payer: Self-pay | Admitting: *Deleted

## 2014-11-26 MED ORDER — BUTALBITAL-APAP-CAFFEINE 50-325-40 MG PO TABS
2.0000 | ORAL_TABLET | Freq: Four times a day (QID) | ORAL | Status: DC | PRN
  Administered 2014-11-26: 2 via ORAL
  Filled 2014-11-26: qty 2

## 2014-11-26 MED ORDER — HYDROMORPHONE HCL 2 MG PO TABS: 2.0000 mg | ORAL_TABLET | ORAL | Status: DC | PRN

## 2014-11-26 MED ORDER — LACTATED RINGERS IV BOLUS (SEPSIS)
1000.0000 mL | Freq: Once | INTRAVENOUS | Status: AC
  Administered 2014-11-26: 1000 mL via INTRAVENOUS

## 2014-11-26 MED ORDER — METOCLOPRAMIDE HCL 5 MG/ML IJ SOLN
10.0000 mg | Freq: Once | INTRAMUSCULAR | Status: AC
  Administered 2014-11-26: 10 mg via INTRAVENOUS
  Filled 2014-11-26: qty 2

## 2014-11-26 MED ORDER — DIPHENHYDRAMINE HCL 50 MG/ML IJ SOLN
25.0000 mg | Freq: Once | INTRAMUSCULAR | Status: AC
  Administered 2014-11-26: 25 mg via INTRAVENOUS
  Filled 2014-11-26: qty 1

## 2014-11-26 MED ORDER — DEXAMETHASONE SODIUM PHOSPHATE 10 MG/ML IJ SOLN
10.0000 mg | Freq: Once | INTRAMUSCULAR | Status: AC
  Administered 2014-11-26: 10 mg via INTRAVENOUS
  Filled 2014-11-26: qty 1

## 2014-11-26 MED ORDER — HYDROMORPHONE HCL 2 MG PO TABS: 4.0000 mg | ORAL_TABLET | ORAL | Status: DC | PRN

## 2014-11-26 NOTE — Progress Notes (Signed)
I received a spiritual care consult for this pt.  Ms Tracie Garcia reports that she has a good support system.  She has a friend caring for her baby and her sister caring for other child.  She is very involved with her church and has already spoken with her bishop this morning and had prayer together.  She stated that right now she is coping okay and is just trying to focus on resting and healing.  She is aware of ongoing availability for support if needs arise.  Chaplain Dyanne CarrelKaty Taeden Geller, Bcc Pager, [phone] 2:57 PM

## 2014-11-27 ENCOUNTER — Other Ambulatory Visit: Payer: Self-pay | Admitting: Certified Nurse Midwife

## 2014-11-27 DIAGNOSIS — O1495 Unspecified pre-eclampsia, complicating the puerperium: Secondary | ICD-10-CM

## 2014-11-27 MED ORDER — CALCIUM CARBONATE ANTACID 500 MG PO CHEW: 2.0000 | CHEWABLE_TABLET | ORAL | Status: DC | PRN

## 2014-11-27 MED ORDER — HYDROCHLOROTHIAZIDE 12.5 MG PO TABS: 12.5000 mg | ORAL_TABLET | ORAL | Status: DC

## 2014-11-27 MED ORDER — TERAZOSIN HCL 5 MG PO CAPS: 5.0000 mg | ORAL_CAPSULE | Freq: Every day | ORAL | Status: DC

## 2014-11-27 MED ORDER — ACETAMINOPHEN 325 MG PO TABS: 650.0000 mg | ORAL_TABLET | ORAL | Status: AC | PRN

## 2014-11-27 MED ORDER — VALSARTAN-HYDROCHLOROTHIAZIDE 160-12.5 MG PO TABS: 1.0000 | ORAL_TABLET | Freq: Every day | ORAL | Status: DC

## 2014-11-27 MED ORDER — BUTALBITAL-APAP-CAFFEINE 50-325-40 MG PO TABS: 2.0000 | ORAL_TABLET | Freq: Four times a day (QID) | ORAL | Status: DC | PRN

## 2014-11-27 NOTE — Discharge Summary (Signed)
OB Discharge Summary     Patient Name: Tracie Garcia DOB: 10-12-1985 MRN: [id number]  Date of admission: 11/25/2014 Delivering MD: This patient has no babies on file.  Date of discharge: 11/27/2014  Admitting diagnosis: delivered 11.4, sent per dr, hbp ha Intrauterine pregnancy: Unknown     Secondary diagnosis:  Active Problems:   Postpartum hypertension  Additional problems:  none     Discharge diagnosis: Postpartum Hypertension, stable                                                                                                Post partum procedures:none   Complications: None  Hospital course: Uneventful  Physical exam  Filed Vitals:   11/27/14 0530 11/27/14 0732 11/27/14 0944 11/27/14 1119  BP: 126/82 126/79  135/90  Pulse: 96 94  89  Temp: 98.6 F (37 C) 98.6 F (37 C)  98.5 F (36.9 C)  TempSrc: Oral Oral  Oral  Resp: 16 18  18   Height:      Weight:   140 lb 1.6 oz (63.549 kg)   SpO2: 95% 96%  98%   General: alert, cooperative and no distress Lochia: appropriate Uterine Fundus: firm Incision: none DVT Evaluation: No evidence of DVT seen on physical exam. No cords or calf tenderness. No significant calf/ankle edema. Labs: Lab Results  Component Value Date   WBC 6.4 11/25/2014   HGB 9.4* 11/25/2014   HCT 27.6* 11/25/2014   MCV 88.2 11/25/2014   PLT 323 11/25/2014   CMP Latest Ref Rng 11/25/2014  Glucose 65 - 99 mg/dL 82  BUN 6 - 20 mg/dL <7(W<5(L)  Creatinine 2.950.44 - 1.00 mg/dL 6.210.74  Sodium 308135 - 657145 mmol/L 141  Potassium 3.5 - 5.1 mmol/L 3.1(L)  Chloride 101 - 111 mmol/L 109  CO2 22 - 32 mmol/L 24  Calcium 8.9 - 10.3 mg/dL 8.4(O8.7(L)  Total Protein 6.5 - 8.1 g/dL 6.5  Total Bilirubin 0.3 - 1.2 mg/dL 0.8  Alkaline Phos 38 - 126 U/L 101  AST 15 - 41 U/L 16  ALT 14 - 54 U/L 14    Discharge instruction: per After Visit Summary and "Baby and Me Booklet".  After visit meds:    Medication List    ASK your doctor about these medications         docusate sodium 100 MG capsule  Commonly known as:  COLACE  Take 100 mg by mouth 2 (two) times daily.     FUSION PLUS Caps  Take 1 capsule by mouth daily before breakfast.     ibuprofen 800 MG tablet  Commonly known as:  ADVIL,MOTRIN  Take 1 tablet (800 mg total) by mouth every 8 (eight) hours as needed for mild pain.     levothyroxine 75 MCG tablet  Commonly known as:  SYNTHROID, LEVOTHROID  Take 1 tablet (75 mcg total) by mouth daily.     oxyCODONE-acetaminophen 5-325 MG tablet  Commonly known as:  PERCOCET/ROXICET  Take 1-2 tablets by mouth every 4 (four) hours as needed for severe pain (once tolerating food).     prenatal vitamin  w/FE, FA 27-1 MG Tabs tablet  Take 1 tablet by mouth daily at 12 noon.     zolpidem 5 MG tablet  Commonly known as:  AMBIEN  Take 1 tablet (5 mg total) by mouth at bedtime as needed for sleep.        Diet: low salt diet  Activity: Advance as tolerated. Pelvic rest for 6 weeks.   Outpatient follow up:1 week Follow up Appt:No future appointments. Follow up Visit:No Follow-up on file.  Postpartum contraception: None   11/27/2014 Roe Coombs, CNM

## 2014-11-27 NOTE — Lactation Note (Signed)
Lactation Consultation Note  Patient Name: Standley BrookingDevonna S Pietrzyk ZOXWR'UToday's Date: 11/27/2014   Initial visit with mom readmitted for elevated BP. Infant was born on 11/4 and is being BF. Infant is not with mom at hospital. Mom has been pumping every 3 hours with DEBP. She says she is getting 6-8 oz a pumping and has been pouring it down the drain because of the medications she has been on. She was on MgSO4 which is now D/C. She is on Labetalol, which is an L2 for BP and Floricet which is an L3, noted to have minimal data listed. She had last dose of Floricet 11/22 @ 0859 and it is considered a short acting medication. Mom plans to be D/C today and will be putting infant back to breast when she returns home. Mom has a DEBP at home for use. She has no questions at this time. Notified her of information in Medications and Mother's milk in regards to her current medications.      Maternal Data    Feeding    LATCH Score/Interventions                      Lactation Tools Discussed/Used     Consult Status      Ed BlalockSharon S Hice 11/27/2014, 8:31 AM

## 2014-11-27 NOTE — Progress Notes (Signed)
Subjective: Patient reports tolerating PO and no problems voiding.  Headache is improved.   Objective: I have reviewed patient's vital signs, intake and output, medications and labs.  General: alert, cooperative and no distress Resp: clear to auscultation bilaterally Cardio: regular rate and rhythm, S1, S2 normal, no murmur, click, rub or gallop GI: soft, non-tender; bowel sounds normal; no masses,  no organomegaly Extremities: extremities normal, atraumatic, no cyanosis or edema, Homans sign is negative, no sign of DVT and no edema, redness or tenderness in the calves or thighs Vaginal Bleeding: minimal   Assessment/Plan: Tracie: PIH stabilized after Mag tx.   Plan for D/C home today.     LOS: 2 days    Tracie Garcia Tracie Garcia 11/27/2014, 11:08 AM

## 2014-11-27 NOTE — Lactation Note (Signed)
Lactation Consultation Note  Patient Name: Tracie BrookingDevonna S Garcia WUJWJ'XToday's Date: 11/27/2014   Consult on mom who was readmitted for elevated BP. Infant was born on 11/4 and has been BF, he is not here with her. Mom reports she has been pumping every 3 hours using DEBP and receiving 6-8 ounces of EBM, which she has been dumping down the drain due to medications she is on. Medications of note are MgSO4, which has been D/C and is compatible with BF. Floricet which is a L3 medication noted to have limited data, Medication is considered short acting. Her last dose was 11/22 @ 0859. Labetalol which is an L2 Medication last taken on 11/23 @ 0534 and considered probably compatible for BF mothers. Advised mother of Medication information in Medications and Mother's Milk by Bobbye Mortonhomas Hale. Mom is hoping to be D/C today and plans to begin BF infant once she gets home. She reports she has a DEBP at home and plenty of milk in the freezer at home that has been fed to the infant during her absence. Enc mom to call with questions/concerns.       Maternal Data    Feeding    LATCH Score/Interventions                      Lactation Tools Discussed/Used     Consult Status      Ed BlalockSharon S Hice 11/27/2014, 8:46 AM

## 2014-11-29 ENCOUNTER — Encounter: Payer: Self-pay | Admitting: Obstetrics

## 2014-11-29 NOTE — Progress Notes (Signed)
[facility]  Coral Ceoharles Harper MD

## 2014-12-03 ENCOUNTER — Ambulatory Visit (INDEPENDENT_AMBULATORY_CARE_PROVIDER_SITE_OTHER): Payer: Medicaid Other | Admitting: Obstetrics

## 2014-12-03 DIAGNOSIS — Z30013 Encounter for initial prescription of injectable contraceptive: Secondary | ICD-10-CM

## 2014-12-03 MED ORDER — MEDROXYPROGESTERONE ACETATE 150 MG/ML IM SUSP: 150.0000 mg | INTRAMUSCULAR | Status: DC

## 2014-12-03 NOTE — Progress Notes (Signed)
Patient ID: Tracie Garcia, female   DOB: [birth year], 29 y.o.   MRN: [id number]  Chief Complaint  Patient presents with  . Follow-up    BP follow up, may like to start depo    HPI Tracie BrookingDevonna S Heasley is a 29 y.o. female.  Thinks that incision may be open.  HPI  Past Medical History  Diagnosis Date  . Abnormal Pap smear   . Gonorrhea   . Chlamydia   . Headache   . Vaginal Pap smear, abnormal     Past Surgical History  Procedure Laterality Date  . Colposcopy    . Dilation and curettage of uterus      tab x2  . Therapeutic abortion    . Cesarean section N/A 11/08/2014    Procedure: CESAREAN SECTION;  Surgeon: Kathreen CosierBernard A Marshall, MD;  Location: [facility];  Service: Obstetrics;  Laterality: N/A;    Family History  Problem Relation Age of Onset  . Adopted: Yes  . Alcohol abuse Neg Hx     Social History Social History  Substance Use Topics  . Smoking status: Former Smoker -- 0.25 packs/day    Types: Cigarettes    Quit date: 03/11/2014  . Smokeless tobacco: Never Used  . Alcohol Use: No    Allergies  Allergen Reactions  . Monistat [Miconazole] Swelling    Current Outpatient Prescriptions  Medication Sig Dispense Refill  . docusate sodium (COLACE) 100 MG capsule Take 100 mg by mouth 2 (two) times daily.    . hydrochlorothiazide (HYDRODIURIL) 12.5 MG tablet Take 1 tablet (12.5 mg total) by mouth every morning. 30 tablet 12  . Iron-FA-B Cmp-C-Biot-Probiotic (FUSION PLUS) CAPS Take 1 capsule by mouth daily before breakfast. 30 capsule 5  . prenatal vitamin w/FE, FA (PRENATAL 1 + 1) 27-1 MG TABS tablet Take 1 tablet by mouth daily at 12 noon.    . valsartan-hydrochlorothiazide (DIOVAN-HCT) 160-12.5 MG tablet Take 1 tablet by mouth daily. 30 tablet 3  . acetaminophen (TYLENOL) 325 MG tablet Take 2 tablets (650 mg total) by mouth every 4 (four) hours as needed (for pain scale < 4  OR  temperature  >/=  100.5 F). 120 tablet 4  . butalbital-acetaminophen-caffeine (FIORICET,  ESGIC) 50-325-40 MG tablet Take 2 tablets by mouth every 6 (six) hours as needed for headache. 45 tablet 4  . calcium carbonate (TUMS - DOSED IN MG ELEMENTAL CALCIUM) 500 MG chewable tablet Chew 2 tablets (400 mg of elemental calcium total) by mouth every 4 (four) hours as needed for indigestion. 200 tablet 3  . ibuprofen (ADVIL,MOTRIN) 800 MG tablet Take 1 tablet (800 mg total) by mouth every 8 (eight) hours as needed for mild pain. 30 tablet 5  . levothyroxine (SYNTHROID, LEVOTHROID) 75 MCG tablet Take 1 tablet (75 mcg total) by mouth daily. (Patient not taking: Reported on 11/25/2014) 30 tablet 11  . medroxyPROGESTERone (DEPO-PROVERA) 150 MG/ML injection Inject 1 mL (150 mg total) into the muscle every 3 (three) months. 1 mL 3  . oxyCODONE-acetaminophen (PERCOCET/ROXICET) 5-325 MG tablet Take 1-2 tablets by mouth every 4 (four) hours as needed for severe pain (once tolerating food). 40 tablet 0  . terazosin (HYTRIN) 5 MG capsule Take 1 capsule (5 mg total) by mouth at bedtime. 30 capsule 12   No current facility-administered medications for this visit.    Review of Systems Review of Systems Constitutional: negative for fatigue and weight loss Respiratory: negative for cough and wheezing Cardiovascular: negative for chest pain, fatigue and palpitations  Gastrointestinal: negative for abdominal pain and change in bowel habits Genitourinary:negative Integument/breast: negative for nipple discharge Musculoskeletal:negative for myalgias Neurological: negative for gait problems and tremors Behavioral/Psych: negative for abusive relationship, depression Endocrine: negative for temperature intolerance     Blood pressure 129/80, pulse 106, weight 130 lb (58.968 kg), unknown if currently breastfeeding.  Physical Exam Physical Exam: General: Alert and no distress Abdomen: Soft, NT.  Area of granulation tissue left corner.  Cauterized with silver nitrate stik.   Data  Reviewed none  Assessment     S/P cesarean section.  Granulation tissue in incision.  Cauterized. BP stable Contraceptive management.  Wants Depo Provera.     Plan    Silver Nitrate cauterization of grarnulation tissue done. Continue BP meds Depo Provera Rx F/U 2 weeks.  No orders of the defined types were placed in this encounter.   Meds ordered this encounter  Medications  . medroxyPROGESTERone (DEPO-PROVERA) 150 MG/ML injection    Sig: Inject 1 mL (150 mg total) into the muscle every 3 (three) months.    Dispense:  1 mL    Refill:  3

## 2014-12-04 ENCOUNTER — Encounter: Payer: Self-pay | Admitting: Obstetrics

## 2014-12-06 ENCOUNTER — Ambulatory Visit (INDEPENDENT_AMBULATORY_CARE_PROVIDER_SITE_OTHER): Payer: Medicaid Other | Admitting: *Deleted

## 2014-12-06 VITALS — BP 101/72 | HR 94 | Temp 98.0°F | Ht 59.0 in | Wt 139.0 lb

## 2014-12-06 DIAGNOSIS — Z30013 Encounter for initial prescription of injectable contraceptive: Secondary | ICD-10-CM

## 2014-12-06 DIAGNOSIS — Z3202 Encounter for pregnancy test, result negative: Secondary | ICD-10-CM | POA: Diagnosis not present

## 2014-12-06 LAB — POCT URINE PREGNANCY: PREG TEST UR: NEGATIVE

## 2014-12-06 MED ORDER — MEDROXYPROGESTERONE ACETATE 150 MG/ML IM SUSP
150.0000 mg | INTRAMUSCULAR | Status: AC
  Administered 2014-12-06: 150 mg via INTRAMUSCULAR

## 2014-12-06 NOTE — Progress Notes (Signed)
Patient in office today for a Depo injection. Pregnancy test in office is negative.  Patient tolerated injection well. Patient due for next injection on February 27, 2015   BP 101/72 mmHg  Pulse 94  Temp(Src) 98 F (36.7 C)  Ht 4\' 11"  (1.499 m)  Wt 139 lb (63.05 kg)  BMI 28.06 kg/m2  Breastfeeding? Yes

## 2014-12-19 ENCOUNTER — Ambulatory Visit: Payer: Medicaid Other | Admitting: Obstetrics

## 2014-12-26 ENCOUNTER — Encounter: Payer: Self-pay | Admitting: Obstetrics

## 2014-12-26 ENCOUNTER — Ambulatory Visit (INDEPENDENT_AMBULATORY_CARE_PROVIDER_SITE_OTHER): Payer: Medicaid Other | Admitting: Obstetrics

## 2014-12-26 DIAGNOSIS — N390 Urinary tract infection, site not specified: Secondary | ICD-10-CM

## 2014-12-26 DIAGNOSIS — Z0000 Encounter for general adult medical examination without abnormal findings: Secondary | ICD-10-CM

## 2014-12-26 MED ORDER — VITAFOL ULTRA 29-0.6-0.4-200 MG PO CAPS: 1.0000 | ORAL_CAPSULE | Freq: Every day | ORAL | Status: DC

## 2014-12-26 MED ORDER — NITROFURANTOIN MONOHYD MACRO 100 MG PO CAPS: 100.0000 mg | ORAL_CAPSULE | Freq: Two times a day (BID) | ORAL | Status: DC

## 2014-12-26 NOTE — Progress Notes (Signed)
Subjective:     Tracie Garcia is a 29 y.o. female who presents for a postpartum visit. She is 7 weeks postpartum following a low cervical transverse Cesarean section. I have fully reviewed the prenatal and intrapartum course. The delivery was at 38 gestational weeks. Outcome: primary cesarean section, low transverse incision. Anesthesia: spinal. Postpartum course has been normal. Baby's course has been normal. Baby is feeding by bottle - Similac Advance. Bleeding no bleeding. Bowel function is normal. Bladder function is normal. Patient is not sexually active. Contraception method is Depo-Provera injections. Postpartum depression screening: negative.  Tobacco, alcohol and substance abuse history reviewed.  Adult immunizations reviewed including TDAP, rubella and varicella.  Patient c/o pain with urination.  The following portions of the patient's history were reviewed and updated as appropriate: allergies, current medications, past family history, past medical history, past social history, past surgical history and problem list.  Review of Systems A comprehensive review of systems was negative.   Objective:    BP 106/72 mmHg  Pulse 100  Temp(Src) 98.3 F (36.8 C)  Wt 130 lb (58.968 kg)  Breastfeeding? Yes  General:  alert and no distress   Breasts:  inspection negative, no nipple discharge or bleeding, no masses or nodularity palpable  Lungs: clear to auscultation bilaterally  Heart:  regular rate and rhythm, S1, S2 normal, no murmur, click, rub or gallop  Abdomen: soft, non-tender; bowel sounds normal; no masses,  no organomegaly.  Incision C,D, I.   Vulva:  normal  Vagina: normal vagina  Cervix:  no cervical motion tenderness  Corpus: normal size, contour, position, consistency, mobility, non-tender  Adnexa:  no mass, fullness, tenderness  Rectal Exam: Not performed.           Assessment:     Normal postpartum exam. Pap smear not done at today's visit.     Postpartum  Hypertension, resolved.   UTI  Plan:    1. Contraception: Depo-Provera injections   2. D/C BP meds. 3. Macrobid Rx 4. Follow up in: 6 weeks or as needed.   Healthy lifestyle practices reviewed

## 2014-12-26 NOTE — Addendum Note (Signed)
Addended by: Henriette CombsHATTON, Athanasia Stanwood L on: 12/26/2014 05:01 PM   Modules accepted: Orders

## 2014-12-30 ENCOUNTER — Other Ambulatory Visit: Payer: Self-pay | Admitting: Obstetrics

## 2014-12-30 DIAGNOSIS — N390 Urinary tract infection, site not specified: Secondary | ICD-10-CM

## 2014-12-30 LAB — URINE CULTURE: Colony Count: >=100000

## 2014-12-30 MED ORDER — AMOXICILLIN-POT CLAVULANATE 875-125 MG PO TABS: 1.0000 | ORAL_TABLET | Freq: Two times a day (BID) | ORAL | Status: DC

## 2015-01-14 ENCOUNTER — Telehealth: Payer: Self-pay | Admitting: *Deleted

## 2015-01-14 NOTE — Telephone Encounter (Signed)
Reached patient today and she states she had an episode on Friday where she felt light headed and dizzy with a spike in her blood pressure. Patient states she was able to check her blood pressure and it was 150s/100s. Patient states she was has had no issue since Friday and is feeling better now. Patient states she was on blood pressure medication after her delivery but is now longer on medication. Patient would like to know what she should do. Patient denies any light headedness, dizziness or visual changes at this time. Patient instructed to contact her primary care physician regarding her blood pressure. Patient verbalized understanding.

## 2015-02-06 ENCOUNTER — Ambulatory Visit: Payer: Medicaid Other | Admitting: Obstetrics

## 2015-02-14 ENCOUNTER — Emergency Department (HOSPITAL_COMMUNITY)
Admission: EM | Admit: 2015-02-14 | Discharge: 2015-02-14 | Disposition: A | Discharge status: Home / Self Care | Payer: No Typology Code available for payment source | Attending: Emergency Medicine | Admitting: Emergency Medicine

## 2015-02-14 ENCOUNTER — Encounter (HOSPITAL_COMMUNITY): Payer: Self-pay | Admitting: Oncology

## 2015-02-14 DIAGNOSIS — S4992XA Unspecified injury of left shoulder and upper arm, initial encounter: Secondary | ICD-10-CM | POA: Insufficient documentation

## 2015-02-14 DIAGNOSIS — M791 Myalgia, unspecified site: Secondary | ICD-10-CM

## 2015-02-14 DIAGNOSIS — S199XXA Unspecified injury of neck, initial encounter: Secondary | ICD-10-CM | POA: Insufficient documentation

## 2015-02-14 DIAGNOSIS — F17210 Nicotine dependence, cigarettes, uncomplicated: Secondary | ICD-10-CM | POA: Diagnosis not present

## 2015-02-14 DIAGNOSIS — Y92410 Unspecified street and highway as the place of occurrence of the external cause: Secondary | ICD-10-CM | POA: Diagnosis not present

## 2015-02-14 DIAGNOSIS — Y998 Other external cause status: Secondary | ICD-10-CM | POA: Insufficient documentation

## 2015-02-14 DIAGNOSIS — Z79899 Other long term (current) drug therapy: Secondary | ICD-10-CM | POA: Insufficient documentation

## 2015-02-14 DIAGNOSIS — Z8619 Personal history of other infectious and parasitic diseases: Secondary | ICD-10-CM | POA: Diagnosis not present

## 2015-02-14 DIAGNOSIS — S3992XA Unspecified injury of lower back, initial encounter: Secondary | ICD-10-CM | POA: Insufficient documentation

## 2015-02-14 DIAGNOSIS — Y9389 Activity, other specified: Secondary | ICD-10-CM | POA: Diagnosis not present

## 2015-02-14 MED ORDER — CYCLOBENZAPRINE HCL 10 MG PO TABS: 10.0000 mg | ORAL_TABLET | Freq: Two times a day (BID) | ORAL | Status: DC | PRN

## 2015-02-14 MED ORDER — OXYCODONE-ACETAMINOPHEN 5-325 MG PO TABS
1.0000 | ORAL_TABLET | Freq: Once | ORAL | Status: AC
  Administered 2015-02-14: 1 via ORAL
  Filled 2015-02-14: qty 1

## 2015-02-14 MED ORDER — NAPROXEN 500 MG PO TABS: 500.0000 mg | ORAL_TABLET | Freq: Two times a day (BID) | ORAL | Status: DC
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
## 2015-02-17 ENCOUNTER — Encounter (HOSPITAL_COMMUNITY): Payer: Self-pay | Admitting: Emergency Medicine

## 2015-02-17 ENCOUNTER — Emergency Department (INDEPENDENT_AMBULATORY_CARE_PROVIDER_SITE_OTHER)
Admission: EM | Admit: 2015-02-17 | Discharge: 2015-02-17 | Disposition: A | Discharge status: Home / Self Care | Payer: Self-pay | Source: Home / Self Care | Attending: Emergency Medicine | Admitting: Emergency Medicine

## 2015-02-17 DIAGNOSIS — S46812A Strain of other muscles, fascia and tendons at shoulder and upper arm level, left arm, initial encounter: Secondary | ICD-10-CM

## 2015-02-17 NOTE — Discharge Instructions (Signed)
Muscle Strain °A muscle strain (pulled muscle) happens when a muscle is stretched beyond normal length. It happens when a sudden, violent force stretches your muscle too far. Usually, a few of the fibers in your muscle are torn. Muscle strain is common in athletes. Recovery usually takes 1-2 weeks. Complete healing takes 5-6 weeks.  °HOME CARE  °· Follow the PRICE method of treatment to help your injury get better. Do this the first 2-3 days after the injury: °¨ Protect. Protect the muscle to keep it from getting injured again. °¨ Rest. Limit your activity and rest the injured body part. °¨ Ice. Put ice in a plastic bag. Place a towel between your skin and the bag. Then, apply the ice and leave it on from 15-20 minutes each hour. After the third day, switch to moist heat packs. °¨ Compression. Use a splint or elastic bandage on the injured area for comfort. Do not put it on too tightly. °¨ Elevate. Keep the injured body part above the level of your heart. °· Only take medicine as told by your doctor. °· Warm up before doing exercise to prevent future muscle strains. °GET HELP IF:  °· You have more pain or puffiness (swelling) in the injured area. °· You feel numbness, tingling, or notice a loss of strength in the injured area. °MAKE SURE YOU:  °· Understand these instructions. °· Will watch your condition. °· Will get help right away if you are not doing well or get worse. °  °This information is not intended to replace advice given to you by your health care provider. Make sure you discuss any questions you have with your health care provider. °  °Document Released: 09/30/2007 Document Revised: 10/11/2012 Document Reviewed: 07/20/2012 °Elsevier Interactive Patient Education ©2016 [employer] ° °
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
## 2015-02-17 NOTE — ED Notes (Signed)
Patient discharged by provider F. Luisa Hart, [place]
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
## 2015-02-27 ENCOUNTER — Ambulatory Visit: Payer: Medicaid Other

## 2015-04-23 ENCOUNTER — Telehealth (HOSPITAL_COMMUNITY): Payer: Self-pay | Admitting: Emergency Medicine

## 2015-04-23 NOTE — ED Notes (Addendum)
Faith, associate staff member reported patient wanting work note from last visit .  Provided copy of note to Faith.

## 2015-07-16 IMAGING — US US OB TRANSVAGINAL
1 series · 14 of 28 positions shown · non-contrast
Comparison: None for this gestational

CLINICAL DATA: Vaginal bleeding in first trimester of pregnancy,
EGA 4 weeks 4 days by LMP of 02/11/2014 ; no quantitative beta HCG
for correlation at this time

EXAM:
OBSTETRIC <14 WK US AND TRANSVAGINAL OB US
TECHNIQUE: Both transabdominal and transvaginal ultrasound examinations were
performed for complete evaluation of the gestation as well as the
maternal uterus, adnexal regions, and pelvic cul-de-sac.
Transvaginal technique was performed to assess early pregnancy.

[Series 1: us ob comp less 14 wks · 14 of 36 slices shown]
[im 2/36]
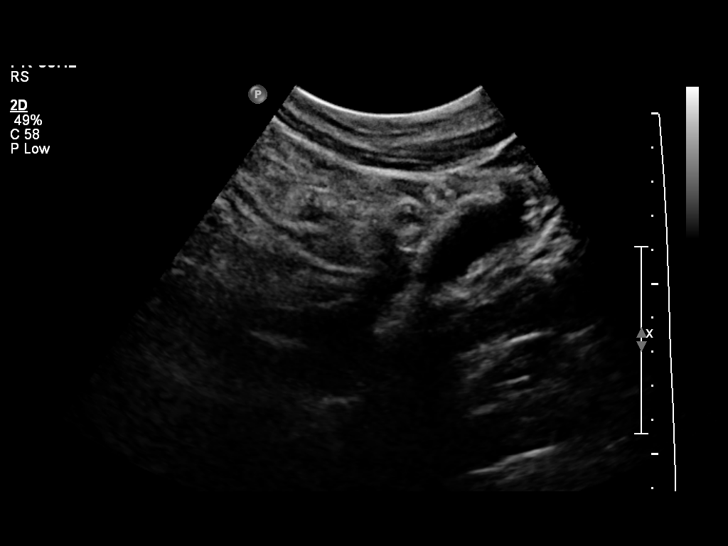
[im 4/36]
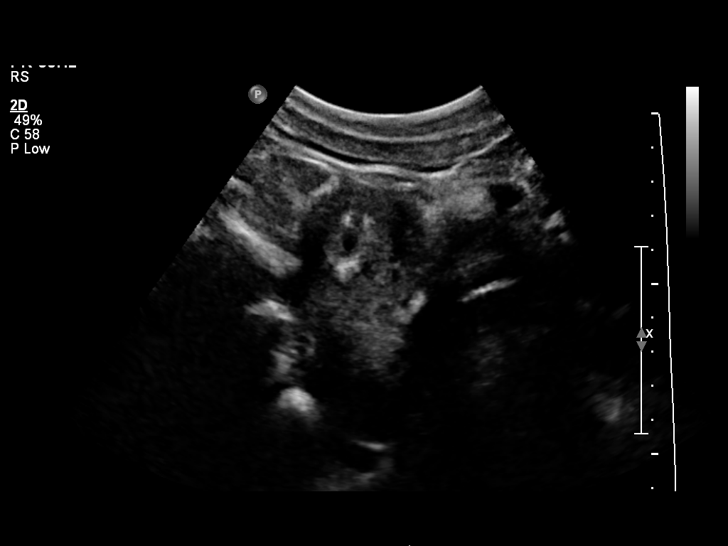
[im 7/36]
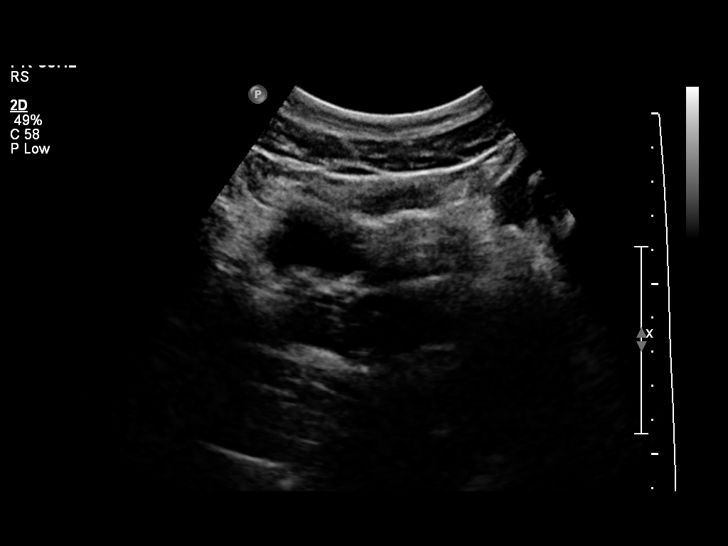
[im 10/36]
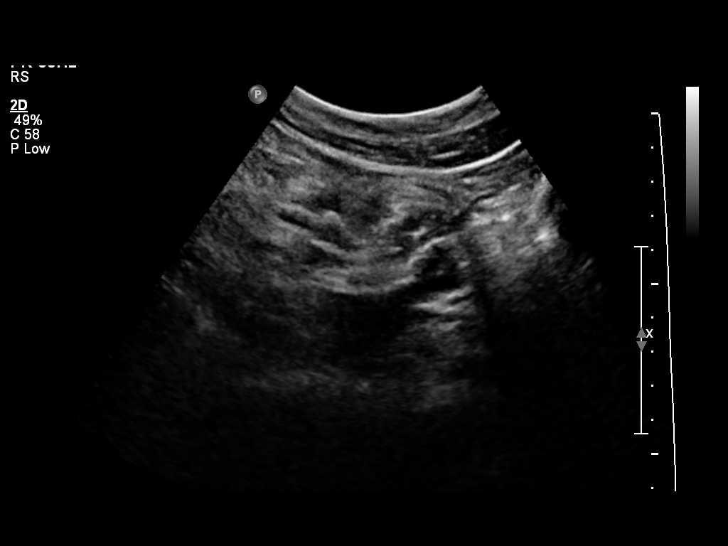
[im 12/36]
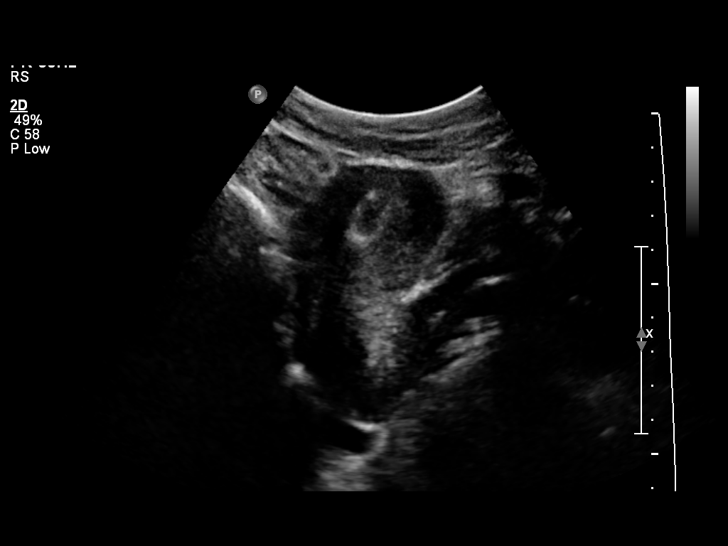
[im 15/36]
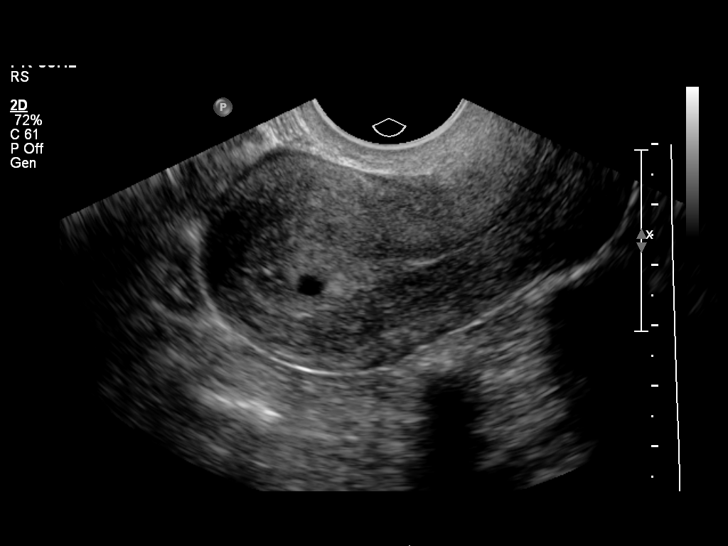
[im 17/36]
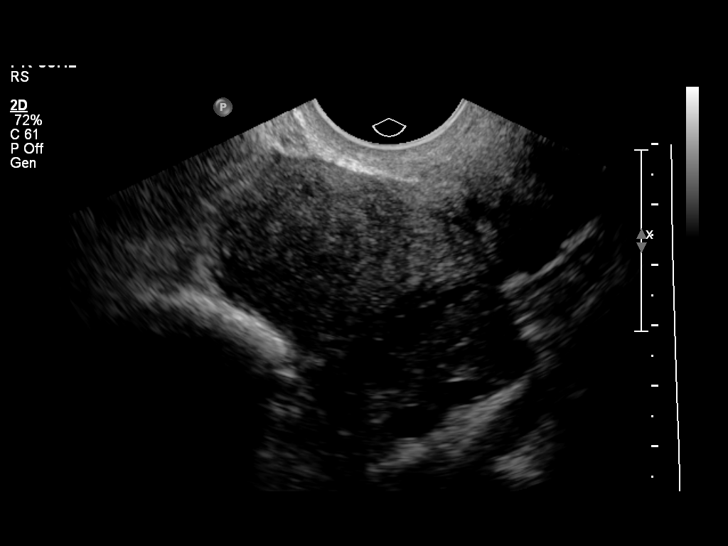
[im 20/36]
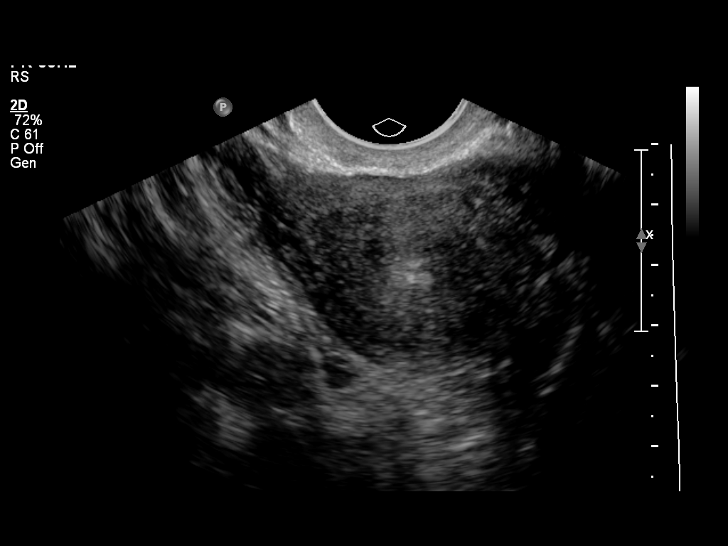
[im 23/36]
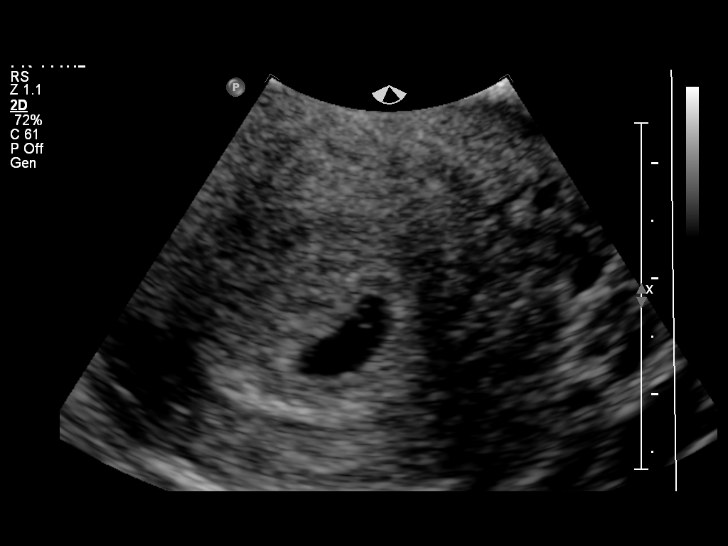
[im 25/36]
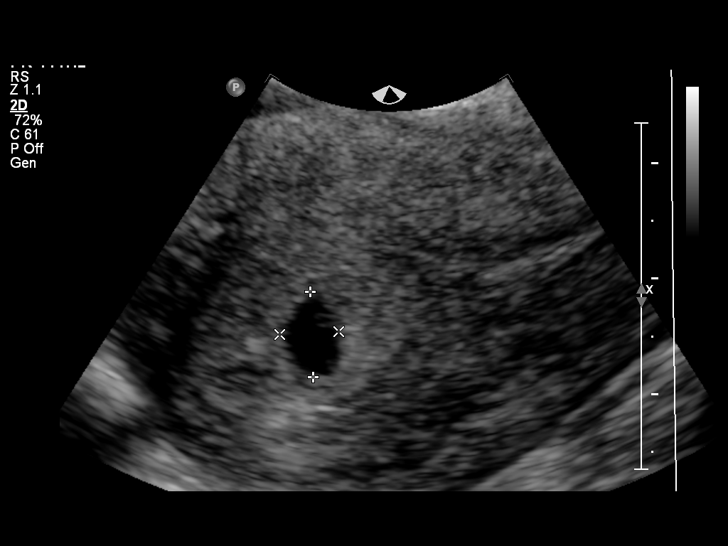
[im 28/36]
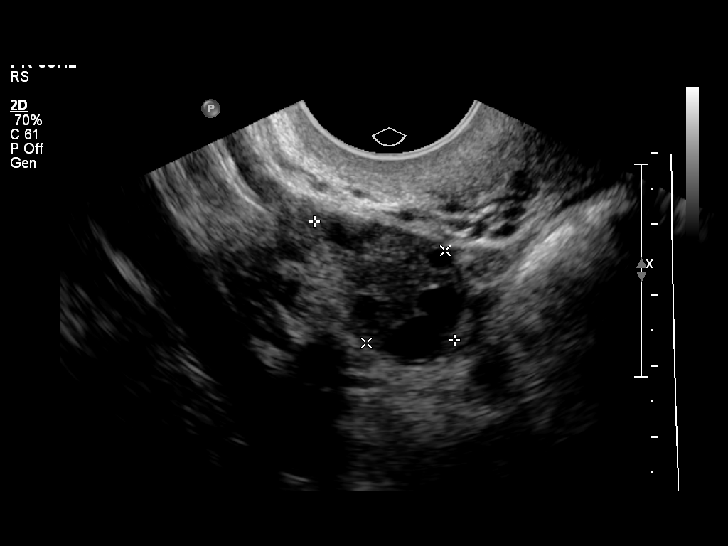
[im 30/36]
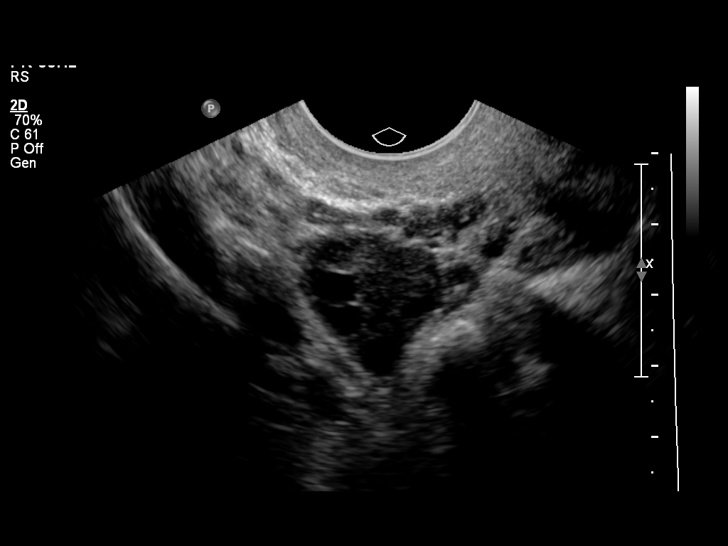
[im 33/36]
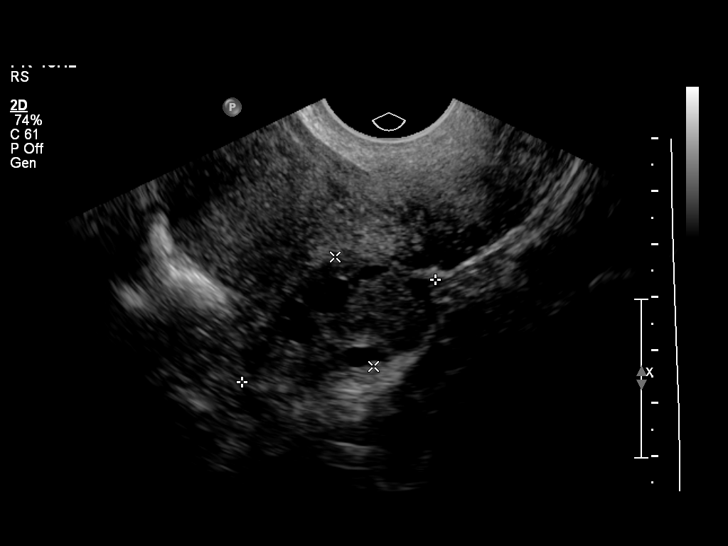
[im 36/36]
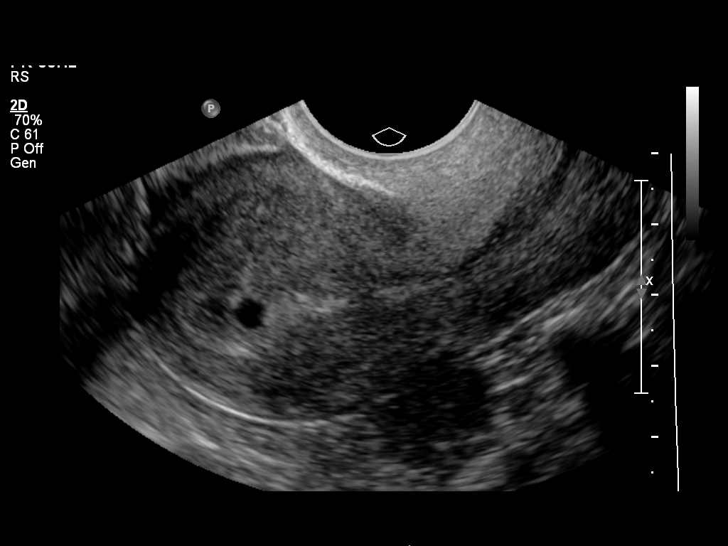

[14 of 28 positions shown; findings below may reference images not displayed]

FINDINGS: Intrauterine gestational sac: Visualized/normal in shape.

Yolk sac:  Present

Embryo:  Not identified

Cardiac Activity: N/A

Heart Rate: N/A  bpm

MSD: 6.0  mm   5 w   1  d

Maternal uterus/adnexae:

No subchorionic hemorrhage.

RIGHT ovary normal size and morphology 2.6 x 1.7 x 2.2 cm.

LEFT ovary normal size and morphology 4.1 x 2.2 x 2.3 cm.

No adnexal masses or free pelvic fluid.
IMPRESSION: Gestational sac with yolk sac in uterus though no fetal pole is
identified.

Remainder of exam unremarkable.

## 2016-05-12 ENCOUNTER — Emergency Department (HOSPITAL_COMMUNITY): Payer: Self-pay

## 2016-05-12 ENCOUNTER — Encounter (HOSPITAL_COMMUNITY): Payer: Self-pay

## 2016-05-12 DIAGNOSIS — G43009 Migraine without aura, not intractable, without status migrainosus: Secondary | ICD-10-CM | POA: Insufficient documentation

## 2016-05-12 DIAGNOSIS — N3000 Acute cystitis without hematuria: Secondary | ICD-10-CM | POA: Insufficient documentation

## 2016-05-12 DIAGNOSIS — Z79899 Other long term (current) drug therapy: Secondary | ICD-10-CM | POA: Insufficient documentation

## 2016-05-12 DIAGNOSIS — F17210 Nicotine dependence, cigarettes, uncomplicated: Secondary | ICD-10-CM | POA: Insufficient documentation

## 2016-05-12 LAB — BASIC METABOLIC PANEL
Anion gap: 10 (ref 5–15)
BUN: 6 mg/dL (ref 6–20)
CALCIUM: 9.3 mg/dL (ref 8.9–10.3)
CO2: 21 mmol/L — ABNORMAL LOW (ref 22–32)
CREATININE: 0.8 mg/dL (ref 0.44–1.00)
Chloride: 106 mmol/L (ref 101–111)
GFR calc Af Amer: >60 mL/min (ref >60)
GFR calc non Af Amer: >60 mL/min (ref >60)
GLUCOSE: 130 mg/dL — AB (ref 65–99)
Potassium: 3.6 mmol/L (ref 3.5–5.1)
Sodium: 137 mmol/L (ref 135–145)

## 2016-05-12 LAB — I-STAT TROPONIN, ED: Troponin i, poc: 0 ng/mL (ref 0.00–0.08)

## 2016-05-12 LAB — CBC
HCT: 34.1 % — ABNORMAL LOW (ref 36.0–46.0)
Hemoglobin: 12.2 g/dL (ref 12.0–15.0)
MCH: 30.7 pg (ref 26.0–34.0)
MCHC: 35.8 g/dL (ref 30.0–36.0)
MCV: 85.9 fL (ref 78.0–100.0)
PLATELETS: 292 10*3/uL (ref 150–400)
RBC: 3.97 MIL/uL (ref 3.87–5.11)
RDW: 12.9 % (ref 11.5–15.5)
WBC: 19.5 10*3/uL — AB (ref 4.0–10.5)

## 2016-05-12 LAB — LIPASE, BLOOD: Lipase: 16 U/L (ref 11–51)

## 2016-05-12 MED ORDER — ONDANSETRON 4 MG PO TBDP
4.0000 mg | ORAL_TABLET | Freq: Once | ORAL | Status: AC | PRN
  Administered 2016-05-12: 4 mg via ORAL
  Filled 2016-05-12: qty 1

## 2016-05-12 NOTE — ED Triage Notes (Signed)
Pt presents with c/o chest pain, generalized body aches, and a migraine. Pt reports her symptoms have been present for a couple of day. Pt reports she has a hx of migraines.

## 2016-05-13 ENCOUNTER — Encounter (HOSPITAL_COMMUNITY): Payer: Self-pay | Admitting: Emergency Medicine

## 2016-05-13 ENCOUNTER — Emergency Department (HOSPITAL_COMMUNITY)
Admission: EM | Admit: 2016-05-13 | Discharge: 2016-05-13 | Disposition: A | Discharge status: Home / Self Care | Payer: Self-pay | Attending: Emergency Medicine | Admitting: Emergency Medicine

## 2016-05-13 DIAGNOSIS — N3000 Acute cystitis without hematuria: Secondary | ICD-10-CM

## 2016-05-13 DIAGNOSIS — G43009 Migraine without aura, not intractable, without status migrainosus: Secondary | ICD-10-CM

## 2016-05-13 LAB — URINALYSIS, ROUTINE W REFLEX MICROSCOPIC
BACTERIA UA: NONE SEEN
BILIRUBIN URINE: NEGATIVE
Glucose, UA: NEGATIVE mg/dL
Hgb urine dipstick: NEGATIVE
KETONES UR: 5 mg/dL — AB
Nitrite: NEGATIVE
PH: 5 (ref 5.0–8.0)
Protein, ur: NEGATIVE mg/dL
SPECIFIC GRAVITY, URINE: 1.016 (ref 1.005–1.030)

## 2016-05-13 LAB — D-DIMER, QUANTITATIVE: D-Dimer, Quant: <0.27 ug/mL-FEU (ref 0.00–0.50)

## 2016-05-13 LAB — RAPID STREP SCREEN (MED CTR MEBANE ONLY): Streptococcus, Group A Screen (Direct): NEGATIVE

## 2016-05-13 MED ORDER — ACETAMINOPHEN 500 MG PO TABS: 1000.0000 mg | ORAL_TABLET | Freq: Once | ORAL | Status: DC

## 2016-05-13 MED ORDER — IBUPROFEN 800 MG PO TABS: 800.0000 mg | ORAL_TABLET | Freq: Three times a day (TID) | ORAL | 0 refills | Status: DC

## 2016-05-13 MED ORDER — NITROFURANTOIN MONOHYD MACRO 100 MG PO CAPS: 100.0000 mg | ORAL_CAPSULE | Freq: Two times a day (BID) | ORAL | 0 refills | Status: DC

## 2016-05-13 MED ORDER — NITROFURANTOIN MONOHYD MACRO 100 MG PO CAPS
100.0000 mg | ORAL_CAPSULE | Freq: Once | ORAL | Status: DC
  Filled 2016-05-13: qty 1

## 2016-05-13 MED ORDER — METOCLOPRAMIDE HCL 10 MG PO TABS
10.0000 mg | ORAL_TABLET | Freq: Three times a day (TID) | ORAL | Status: DC
  Filled 2016-05-13: qty 1

## 2016-05-13 MED ORDER — KETOROLAC TROMETHAMINE 60 MG/2ML IM SOLN
60.0000 mg | Freq: Once | INTRAMUSCULAR | Status: DC
  Filled 2016-05-13: qty 2

## 2016-05-13 NOTE — ED Provider Notes (Signed)
[facility]-EMERGENCY DEPT Provider Note   CSN: [id number] Arrival date & time: 05/12/16  2316  By signing my name below, I, Cynda Acres, attest that this documentation has been prepared under the direction and in the presence of Asa Baudoin, MD. Electronically Signed: Cynda Acres, Scribe. 05/13/16. 3:34 AM.  History   Chief Complaint Chief Complaint  Patient presents with  . Chest Pain  . Migraine  . Emesis    HPI Comments: ARVIS MIGUEZ is a 31 y.o. female with no pertinent past medical history, who presents to the Emergency Department complaining of sudden-onset, constant headache that began several days ago. Patient reports a history of migraines, gets one once a month. Patient is not followed by a neurologist. Patient is currently on birth control, depo shot. Patient reports associated rhinorrhea and photophobia.Pateint reports taking tylenol with no relief. Patient denies any LE edema, recent travel, fever, chills, nausea, vomiting, or any other symptoms.   Patient sleeping in room upon entry.   The history is provided by the patient. No language interpreter was used.  Migraine  This is a new problem. The current episode started more than 2 days ago. The problem occurs constantly. The problem has not changed since onset.Associated symptoms include headaches. Pertinent negatives include no chest pain, no abdominal pain and no shortness of breath. Nothing aggravates the symptoms. Nothing relieves the symptoms. She has tried acetaminophen for the symptoms. The treatment provided no relief.    Past Medical History:  Diagnosis Date  . Abnormal Pap smear   . Chlamydia   . Gonorrhea   . Headache   . Vaginal Pap smear, abnormal     Patient Active Problem List   Diagnosis Date Noted  . 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 Normal labor 11/08/2014  . S/P emergency cesarean section 11/08/2014  . Hyperemesis gravidarum before end of [redacted] week gestation, dehydration 03/26/2014    . Thrombosed external hemorrhoids 07/20/2012    Past Surgical History:  Procedure Laterality Date  . CESAREAN SECTION N/A 11/08/2014   Procedure: CESAREAN SECTION;  Surgeon: Kathreen Cosier, MD;  Location: [facility];  Service: Obstetrics;  Laterality: N/A;  . COLPOSCOPY    . DILATION AND CURETTAGE OF UTERUS     tab x2  . THERAPEUTIC ABORTION      OB History    Gravida Para Term Preterm AB Living   6 3 3  0 3 3   SAB TAB Ectopic Multiple Live Births   0 3 0 0 3       Home Medications    Prior to Admission medications   Medication Sig Start Date End Date Taking? Authorizing Provider  acetaminophen (TYLENOL) 325 MG tablet Take 2 tablets (650 mg total) by mouth every 4 (four) hours as needed (for pain scale < 4  OR  temperature  >/=  100.5 F). Patient not taking: Reported on 12/26/2014 11/27/14   Orvilla Cornwall A, CNM  amoxicillin-clavulanate (AUGMENTIN) 875-125 MG tablet Take 1 tablet by mouth 2 (two) times daily. 12/30/14   Brock Bad, MD  butalbital-acetaminophen-caffeine (FIORICET, ESGIC) [phone] MG tablet Take 2 tablets by mouth every 6 (six) hours as needed for headache. Patient not taking: Reported on 12/26/2014 11/27/14   Orvilla Cornwall A, CNM  calcium carbonate (TUMS - DOSED IN MG ELEMENTAL CALCIUM) 500 MG chewable tablet Chew 2 tablets (400 mg of elemental calcium total) by mouth every 4 (four) hours as needed for indigestion. Patient not taking: Reported on 12/26/2014 11/27/14   Orvilla Cornwall  A, CNM  cyclobenzaprine (FLEXERIL) 10 MG tablet Take 1 tablet (10 mg total) by mouth 2 (two) times daily as needed for muscle spasms. 02/14/15   Dowless, Lelon Mast Tripp, PA-C  docusate sodium (COLACE) 100 MG capsule Take 100 mg by mouth 2 (two) times daily. Reported on 12/26/2014    [provider]  hydrochlorothiazide (HYDRODIURIL) 12.5 MG tablet Take 1 tablet (12.5 mg total) by mouth every morning. Patient not taking: Reported on 12/26/2014 11/27/14   Orvilla Cornwall A, CNM  ibuprofen (ADVIL,MOTRIN) 800 MG tablet Take 1 tablet (800 mg total) by mouth every 8 (eight) hours as needed for mild pain. Patient not taking: Reported on 12/26/2014 11/11/14   Brock Bad, MD  Iron-FA-B Cmp-C-Biot-Probiotic (FUSION PLUS) CAPS Take 1 capsule by mouth daily before breakfast. Patient not taking: Reported on 12/26/2014 11/11/14   Brock Bad, MD  levothyroxine (SYNTHROID, LEVOTHROID) 75 MCG tablet Take 1 tablet (75 mcg total) by mouth daily. 11/13/14   Brock Bad, MD  medroxyPROGESTERone (DEPO-PROVERA) 150 MG/ML injection Inject 1 mL (150 mg total) into the muscle every 3 (three) months. 12/03/14   Brock Bad, MD  naproxen (NAPROSYN) 500 MG tablet Take 1 tablet (500 mg total) by mouth 2 (two) times daily. 02/14/15   Dowless, Lelon Mast Tripp, PA-C  nitrofurantoin, macrocrystal-monohydrate, (MACROBID) 100 MG capsule Take 1 capsule (100 mg total) by mouth 2 (two) times daily. 12/26/14   Brock Bad, MD  oxyCODONE-acetaminophen (PERCOCET/ROXICET) 5-325 MG tablet Take 1-2 tablets by mouth every 4 (four) hours as needed for severe pain (once tolerating food). Patient not taking: Reported on 12/26/2014 11/11/14   Brock Bad, MD  Prenat-Fe Poly-Methfol-FA-DHA (VITAFOL ULTRA) 29-0.6-0.4-200 MG CAPS Take 1 capsule by mouth daily before breakfast. 12/26/14   Brock Bad, MD  prenatal vitamin w/FE, FA (PRENATAL 1 + 1) 27-1 MG TABS tablet Take 1 tablet by mouth daily at 12 noon. Reported on 12/26/2014    [provider]  terazosin (HYTRIN) 5 MG capsule Take 1 capsule (5 mg total) by mouth at bedtime. Patient not taking: Reported on 12/26/2014 11/27/14   Orvilla Cornwall A, CNM  valsartan-hydrochlorothiazide (DIOVAN-HCT) 160-12.5 MG tablet Take 1 tablet by mouth daily. Patient not taking: Reported on 12/26/2014 11/27/14   Roe Coombs, CNM    Family History Family History  Problem Relation Age of Onset  . Adopted: Yes  .  Alcohol abuse Neg Hx     Social History Social History  Substance Use Topics  . Smoking status: Current Every Day Smoker    Packs/day: 0.25    Types: Cigarettes  . Smokeless tobacco: Never Used  . Alcohol use No     Allergies   Monistat [miconazole]   Review of Systems Review of Systems  Constitutional: Negative for chills and fever.  HENT: Positive for congestion and rhinorrhea. Negative for sinus pain, sinus pressure, sneezing, sore throat, tinnitus, trouble swallowing and voice change.   Respiratory: Negative for shortness of breath, wheezing and stridor.   Cardiovascular: Negative for chest pain, palpitations and leg swelling.  Gastrointestinal: Negative for abdominal pain, nausea and vomiting.  Neurological: Positive for headaches. Negative for tremors, seizures, syncope, facial asymmetry, speech difficulty, weakness, light-headedness and numbness.  All other systems reviewed and are negative.    Physical Exam Updated Vital Signs BP 121/72 (BP Location: Left Arm)   Pulse 95   Temp 98.6 F (37 C) (Oral)   Resp 18   Ht 4\' 11"  (1.499 m)  Wt 120 lb (54.4 kg)   LMP 02/05/2016 (Approximate)   SpO2 100%   BMI 24.24 kg/m   Physical Exam  Constitutional: She is oriented to person, place, and time. She appears well-developed and well-nourished. No distress.  Sleeping in room.  HENT:  Head: Normocephalic and atraumatic.  Mouth/Throat: Oropharynx is clear and moist. No oropharyngeal exudate.  Eyes: Conjunctivae and EOM are normal. Pupils are equal, round, and reactive to light.  Neck: Normal range of motion. Neck supple.  No bruits.  Cardiovascular: Normal rate, regular rhythm, normal heart sounds and intact distal pulses.  Exam reveals no gallop and no friction rub.   No murmur heard. Pulmonary/Chest: Effort normal and breath sounds normal. No stridor. She has no wheezes. She has no rales.  Abdominal: Soft. Bowel sounds are normal. There is no tenderness. There is  no rebound and no guarding.  Musculoskeletal: Normal range of motion. She exhibits no edema, tenderness or deformity.  All compartments soft. Intact distal pulses.   Lymphadenopathy:    She has no cervical adenopathy.  Neurological: She is alert and oriented to person, place, and time. She displays normal reflexes. No cranial nerve deficit.  Skin: Skin is warm and dry. Capillary refill takes less than 2 seconds. She is not diaphoretic.  Psychiatric: She has a normal mood and affect.  Nursing note and vitals reviewed.    ED Treatments / Results   Vitals:   05/13/16 0320 05/13/16 0542  BP: 121/72 108/72  Pulse: 88 80  Resp: 20 19  Temp:      DIAGNOSTIC STUDIES: Oxygen Saturation is 100% on RA, normal by my interpretation.    COORDINATION OF CARE: 3:34 AM Discussed treatment plan with pt at bedside and pt agreed to plan, which includes a migraine cocktail.   Labs (all labs ordered are listed, but only abnormal results are displayed)  Results for orders placed or performed during the hospital encounter of 05/13/16  Rapid strep screen  Result Value Ref Range   Streptococcus, Group A Screen (Direct) NEGATIVE NEGATIVE  Basic metabolic panel  Result Value Ref Range   Sodium 137 135 - 145 mmol/L   Potassium 3.6 3.5 - 5.1 mmol/L   Chloride 106 101 - 111 mmol/L   CO2 21 (L) 22 - 32 mmol/L   Glucose, Bld 130 (H) 65 - 99 mg/dL   BUN 6 6 - 20 mg/dL   Creatinine, Ser 4.09 0.44 - 1.00 mg/dL   Calcium 9.3 8.9 - 81.1 mg/dL   GFR calc non Af Amer >60 >60 mL/min   GFR calc Af Amer >60 >60 mL/min   Anion gap 10 5 - 15  CBC  Result Value Ref Range   WBC 19.5 (H) 4.0 - 10.5 K/uL   RBC 3.97 3.87 - 5.11 MIL/uL   Hemoglobin 12.2 12.0 - 15.0 g/dL   HCT 91.4 (L) 78.2 - 95.6 %   MCV 85.9 78.0 - 100.0 fL   MCH 30.7 26.0 - 34.0 pg   MCHC 35.8 30.0 - 36.0 g/dL   RDW 21.3 08.6 - 57.8 %   Platelets 292 150 - 400 K/uL  Lipase, blood  Result Value Ref Range   Lipase 16 11 - 51 U/L    Urinalysis, Routine w reflex microscopic  Result Value Ref Range   Color, Urine YELLOW YELLOW   APPearance CLOUDY (A) CLEAR   Specific Gravity, Urine 1.016 1.005 - 1.030   pH 5.0 5.0 - 8.0   Glucose, UA NEGATIVE NEGATIVE mg/dL  Hgb urine dipstick NEGATIVE NEGATIVE   Bilirubin Urine NEGATIVE NEGATIVE   Ketones, ur 5 (A) NEGATIVE mg/dL   Protein, ur NEGATIVE NEGATIVE mg/dL   Nitrite NEGATIVE NEGATIVE   Leukocytes, UA TRACE (A) NEGATIVE   RBC / HPF 0-5 0 - 5 RBC/hpf   WBC, UA 6-30 0 - 5 WBC/hpf   Bacteria, UA NONE SEEN NONE SEEN   Squamous Epithelial / LPF 6-30 (A) NONE SEEN   Mucous PRESENT   D-dimer, quantitative (not at [facility])  Result Value Ref Range   D-Dimer, Quant <0.27 0.00 - 0.50 ug/mL-FEU  I-stat troponin, ED  Result Value Ref Range   Troponin i, poc 0.00 0.00 - 0.08 ng/mL   Comment 3           Dg Chest 2 View  Result Date: 05/13/2016 CLINICAL DATA:  Acute onset of generalized chest pain and body aches. Migraine headache. Initial encounter. EXAM: CHEST  2 VIEW COMPARISON:  Chest radiograph performed 07/20/2013 FINDINGS: The lungs are well-aerated and clear. There is no evidence of focal opacification, pleural effusion or pneumothorax. The heart is normal in size; the mediastinal contour is within normal limits. No acute osseous abnormalities are seen. IMPRESSION: No acute cardiopulmonary process seen. Electronically Signed   By: Roanna Raider M.D.   On: 05/13/2016 00:02    EKG  EKG Interpretation  Date/Time:  Wednesday May 12 2016 23:47:47 EDT Ventricular Rate:  105 PR Interval:    QRS Duration: 84 QT Interval:  316 QTC Calculation: 418 R Axis:   63 Text Interpretation:  Sinus tachycardia Confirmed by Amelia Macken ([id number]) on 05/13/2016 3:14:12 AM       Radiology Dg Chest 2 View  Result Date: 05/13/2016 CLINICAL DATA:  Acute onset of generalized chest pain and body aches. Migraine headache. Initial encounter. EXAM: CHEST  2 VIEW COMPARISON:  Chest radiograph  performed 07/20/2013 FINDINGS: The lungs are well-aerated and clear. There is no evidence of focal opacification, pleural effusion or pneumothorax. The heart is normal in size; the mediastinal contour is within normal limits. No acute osseous abnormalities are seen. IMPRESSION: No acute cardiopulmonary process seen. Electronically Signed   By: Roanna Raider M.D.   On: 05/13/2016 00:02    Procedures Procedures (including critical care time)  Medications Ordered in ED  Medications  ketorolac (TORADOL) injection 60 mg (not administered)  metoCLOPramide (REGLAN) tablet 10 mg (not administered)  acetaminophen (TYLENOL) tablet 1,000 mg (not administered)  nitrofurantoin (macrocrystal-monohydrate) (MACROBID) capsule 100 mg (not administered)  ondansetron (ZOFRAN-ODT) disintegrating tablet 4 mg (4 mg Oral Given 05/12/16 2343)     Final Clinical Impressions(s) / ED Diagnoses  Patient is here with a migraine headache and elevated white count: will treat for UTI and have patient follow up with their PMD. I have reviewed the triage vital signs and the nursing notes. Pertinent labs & imaging results that were available during my care of the patient were reviewed by me and considered in my medical decision making (see chart for details). The patient is nontoxic-appearing on exam and vital signs are within normal limits. Return for fevers, weakness, numbness, neck pain or stiffness, inability to make or understand speech or any concerns.    After history, exam, and medical workup I feel the patient has been appropriately medically screened and is safe for discharge home. Pertinent diagnoses were discussed with the patient. Patient was given return precautions.   I personally performed the services described in this documentation, which was scribed in my presence. The  recorded information has been reviewed and is accurate.       Carsen Leaf, MD 05/13/16 [phone]

## 2016-05-15 LAB — CULTURE, GROUP A STREP (THRC)

## 2017-07-13 ENCOUNTER — Inpatient Hospital Stay (HOSPITAL_COMMUNITY)
Admission: AD | Admit: 2017-07-13 | Discharge: 2017-07-13 | Disposition: A | Discharge status: Home / Self Care | Payer: Self-pay | Source: Ambulatory Visit | Attending: Obstetrics and Gynecology | Admitting: Obstetrics and Gynecology

## 2017-07-13 ENCOUNTER — Encounter (HOSPITAL_COMMUNITY): Payer: Self-pay | Admitting: *Deleted

## 2017-07-13 DIAGNOSIS — R109 Unspecified abdominal pain: Secondary | ICD-10-CM | POA: Insufficient documentation

## 2017-07-13 DIAGNOSIS — N912 Amenorrhea, unspecified: Secondary | ICD-10-CM

## 2017-07-13 DIAGNOSIS — Z8619 Personal history of other infectious and parasitic diseases: Secondary | ICD-10-CM | POA: Insufficient documentation

## 2017-07-13 DIAGNOSIS — F17210 Nicotine dependence, cigarettes, uncomplicated: Secondary | ICD-10-CM | POA: Insufficient documentation

## 2017-07-13 DIAGNOSIS — Z3202 Encounter for pregnancy test, result negative: Secondary | ICD-10-CM

## 2017-07-13 LAB — URINALYSIS, ROUTINE W REFLEX MICROSCOPIC
Bilirubin Urine: NEGATIVE
GLUCOSE, UA: NEGATIVE mg/dL
HGB URINE DIPSTICK: NEGATIVE
Ketones, ur: NEGATIVE mg/dL
LEUKOCYTES UA: NEGATIVE
Nitrite: NEGATIVE
PH: 5 (ref 5.0–8.0)
PROTEIN: NEGATIVE mg/dL
Specific Gravity, Urine: 1.015 (ref 1.005–1.030)

## 2017-07-13 LAB — POCT PREGNANCY, URINE: PREG TEST UR: NEGATIVE

## 2017-07-13 NOTE — Discharge Instructions (Signed)
Secondary Amenorrhea Secondary amenorrhea is the stopping of menstrual flow for 3-6 months in a female who has previously had periods. There are many possible causes. Most of these causes are not serious. Usually, treating the underlying problem causing the loss of menses will return your periods to normal. What are the causes? Some common and uncommon causes of not menstruating include:  Malnutrition.  Low blood sugar (hypoglycemia).  Polycystic ovary disease.  Stress or fear.  Breastfeeding.  Hormone imbalance.  Ovarian failure.  Medicines.  Extreme obesity.  Cystic fibrosis.  Low body weight or drastic weight reduction from any cause.  Early menopause.  Removal of ovaries or uterus.  Contraceptives.  Illness.  Long-term (chronic) illnesses.  Cushing syndrome.  Thyroid problems.  Birth control pills, patches, or vaginal rings for birth control.  What increases the risk? You may be at greater risk of secondary amenorrhea if:  You have a family history of this condition.  You have an eating disorder.  You do athletic training.  How is this diagnosed? A diagnosis is made by your health care provider taking a medical history and doing a physical exam. This will include a pelvic exam to check for problems with your reproductive organs. Pregnancy must be ruled out. Often, numerous blood tests are done to measure different hormones in the body. Urine testing may be done. Specialized exams (ultrasound, CT scan, MRI, or hysteroscopy) may have to be done as well as measuring the body mass index (BMI). How is this treated? Treatment depends on the cause of the amenorrhea. If an eating disorder is present, this can be treated with an adequate diet and therapy. Chronic illnesses may improve with treatment of the illness. Amenorrhea may be corrected with medicines, lifestyle changes, or surgery. If the amenorrhea cannot be corrected, it is sometimes possible to create a  false menstruation with medicines. Follow these instructions at home:  Maintain a healthy diet.  Manage weight problems.  Exercise regularly but not excessively.  Get adequate sleep.  Manage stress.  Be aware of changes in your menstrual cycle. Keep a record of when your periods occur. Note the date your period starts, how long it lasts, and any problems. Contact a health care provider if: Your symptoms do not get better with treatment. This information is not intended to replace advice given to you by your health care provider. Make sure you discuss any questions you have with your health care provider. Document Released: 02/01/2006 Document Revised: 05/29/2015 Document Reviewed: 06/08/2012 Elsevier Interactive Patient Education  2018 [employer]  

## 2017-07-13 NOTE — MAU Note (Signed)
Pt left with getting discharge paper work and signing.

## 2017-07-13 NOTE — MAU Note (Signed)
Pt reports she is 40 days late for her period, has had 2 negative preg tests. Some mild lower abd cramping and some breast tenderness.

## 2017-07-13 NOTE — MAU Provider Note (Signed)
History     CSN: [id number]  Arrival date and time: 07/13/17 1330   First Provider Initiated Contact with Patient 07/13/17 1429      Chief Complaint  Patient presents with  . Possible Pregnancy  . Abdominal Pain   31 y.o. Female [id number] here with amenorrhea. LMP was 05/15/17. She is sexually active and using condoms sometimes. She was using Depo Provera and last injection was in 08/2016. Last unprotected IC was early June. No new partner. No concern for STIs. She has hx of abnormal TSH and was on Synthroid a few years ago but not currently. She took 2 HPT and were negative. She does not want to become pregnant but stopped Depo d/t finances.   Past Medical History:  Diagnosis Date  . Abnormal Pap smear   . Chlamydia   . Gonorrhea   . Headache   . Vaginal Pap smear, abnormal     Past Surgical History:  Procedure Laterality Date  . CESAREAN SECTION N/A 11/08/2014   Procedure: CESAREAN SECTION;  Surgeon: Kathreen Cosier, MD;  Location: [facility];  Service: Obstetrics;  Laterality: N/A;  . COLPOSCOPY    . DILATION AND CURETTAGE OF UTERUS     tab x2  . THERAPEUTIC ABORTION      Family History  Adopted: Yes  Problem Relation Age of Onset  . Alcohol abuse Neg Hx     Social History   Tobacco Use  . Smoking status: Current Every Day Smoker    Packs/day: 0.25    Types: Cigarettes  . Smokeless tobacco: Never Used  Substance Use Topics  . Alcohol use: No    Alcohol/week: 0.0 oz  . Drug use: Yes    Types: Marijuana    Comment: 07/13/2017-last use    Allergies:  Allergies  Allergen Reactions  . Monistat [Miconazole] Swelling    Medications Prior to Admission  Medication Sig Dispense Refill Last Dose  . acetaminophen (TYLENOL) 325 MG tablet Take 2 tablets (650 mg total) by mouth every 4 (four) hours as needed (for pain scale < 4  OR  temperature  >/=  100.5 F). (Patient not taking: Reported on 12/26/2014) 120 tablet 4 Unknown at Unknown time  . amoxicillin-clavulanate  (AUGMENTIN) 875-125 MG tablet Take 1 tablet by mouth 2 (two) times daily. (Patient not taking: Reported on 05/13/2016) 14 tablet 1 Not Taking at Unknown time  . butalbital-acetaminophen-caffeine (FIORICET, ESGIC) 50-325-40 MG tablet Take 2 tablets by mouth every 6 (six) hours as needed for headache. (Patient not taking: Reported on 12/26/2014) 45 tablet 4 Unknown at Unknown time  . calcium carbonate (TUMS - DOSED IN MG ELEMENTAL CALCIUM) 500 MG chewable tablet Chew 2 tablets (400 mg of elemental calcium total) by mouth every 4 (four) hours as needed for indigestion. (Patient not taking: Reported on 12/26/2014) 200 tablet 3 Unknown at Unknown time  . cyclobenzaprine (FLEXERIL) 10 MG tablet Take 1 tablet (10 mg total) by mouth 2 (two) times daily as needed for muscle spasms. (Patient not taking: Reported on 05/13/2016) 20 tablet 0 Not Taking at Unknown time  . hydrochlorothiazide (HYDRODIURIL) 12.5 MG tablet Take 1 tablet (12.5 mg total) by mouth every morning. (Patient not taking: Reported on 12/26/2014) 30 tablet 12 Unknown at Unknown time  . ibuprofen (ADVIL,MOTRIN) 800 MG tablet Take 1 tablet (800 mg total) by mouth 3 (three) times daily. 21 tablet 0   . Iron-FA-B Cmp-C-Biot-Probiotic (FUSION PLUS) CAPS Take 1 capsule by mouth daily before breakfast. (Patient not taking: Reported  on 12/26/2014) 30 capsule 5 Unknown at Unknown time  . levothyroxine (SYNTHROID, LEVOTHROID) 75 MCG tablet Take 1 tablet (75 mcg total) by mouth daily. (Patient not taking: Reported on 05/13/2016) 30 tablet 11 Not Taking at Unknown time  . medroxyPROGESTERone (DEPO-PROVERA) 150 MG/ML injection Inject 1 mL (150 mg total) into the muscle every 3 (three) months. (Patient not taking: Reported on 05/13/2016) 1 mL 3 Not Taking at Unknown time  . naproxen (NAPROSYN) 500 MG tablet Take 1 tablet (500 mg total) by mouth 2 (two) times daily. (Patient not taking: Reported on 05/13/2016) 30 tablet 0 Not Taking at Unknown time  . nitrofurantoin,  macrocrystal-monohydrate, (MACROBID) 100 MG capsule Take 1 capsule (100 mg total) by mouth 2 (two) times daily. X 7 days 14 capsule 0   . oxyCODONE-acetaminophen (PERCOCET/ROXICET) 5-325 MG tablet Take 1-2 tablets by mouth every 4 (four) hours as needed for severe pain (once tolerating food). (Patient not taking: Reported on 12/26/2014) 40 tablet 0 Unknown at Unknown time  . Prenat-Fe Poly-Methfol-FA-DHA (VITAFOL ULTRA) 29-0.6-0.4-200 MG CAPS Take 1 capsule by mouth daily before breakfast. (Patient not taking: Reported on 05/13/2016) 30 capsule 11 Not Taking at Unknown time  . terazosin (HYTRIN) 5 MG capsule Take 1 capsule (5 mg total) by mouth at bedtime. (Patient not taking: Reported on 12/26/2014) 30 capsule 12 Unknown at Unknown time  . valsartan-hydrochlorothiazide (DIOVAN-HCT) 160-12.5 MG tablet Take 1 tablet by mouth daily. (Patient not taking: Reported on 12/26/2014) 30 tablet 3 Not Taking    Review of Systems  Constitutional: Negative.   Gastrointestinal: Negative.   Genitourinary: Negative.    Physical Exam   Blood pressure (!) 129/96, pulse 74, temperature 98.1 F (36.7 C), temperature source Oral, resp. rate 15, height 4\' 11"  (1.499 m), weight 125 lb (56.7 kg), last menstrual period 05/15/2017, SpO2 100 %.  Physical Exam  Constitutional: She is oriented to person, place, and time. She appears well-developed and well-nourished. No distress.  HENT:  Head: Normocephalic.  Neck: Normal range of motion.  Cardiovascular: Normal rate.  Respiratory: Effort normal. No respiratory distress.  Musculoskeletal: Normal range of motion.  Neurological: She is alert and oriented to person, place, and time.  Psychiatric: She has a normal mood and affect.   Results for orders placed or performed during the hospital encounter of 07/13/17 (from the past 24 hour(s))  Urinalysis, Routine w reflex microscopic     Status: None   Collection Time: 07/13/17  2:36 PM  Result Value Ref Range   Color,  Urine YELLOW YELLOW   APPearance CLEAR CLEAR   Specific Gravity, Urine 1.015 1.005 - 1.030   pH 5.0 5.0 - 8.0   Glucose, UA NEGATIVE NEGATIVE mg/dL   Hgb urine dipstick NEGATIVE NEGATIVE   Bilirubin Urine NEGATIVE NEGATIVE   Ketones, ur NEGATIVE NEGATIVE mg/dL   Protein, ur NEGATIVE NEGATIVE mg/dL   Nitrite NEGATIVE NEGATIVE   Leukocytes, UA NEGATIVE NEGATIVE  Pregnancy, urine POC     Status: None   Collection Time: 07/13/17  2:40 PM  Result Value Ref Range   Preg Test, Ur NEGATIVE NEGATIVE   MAU Course  Procedures  MDM Labs ordered and reviewed. No evidence of pregnancy. Discussed skipped cycles can happen during reproductive years in the absence of pregnancy but she will need further evaluation if amenorrhea persists. Recommend weekly HPT until menses starts or if has +HPT contact OBGYN for f/u. Recommend [facility] or [facility] for more affordable contraception. Stable for discharge home.   Assessment and  Plan   1. Pregnancy examination or test, negative result   2. Amenorrhea    Discharge home Follow up with Dr. Clearance Coots in 2 weeks Return for emergencies  Allergies as of 07/13/2017      Reactions   Monistat [miconazole] Swelling      Medication List    STOP taking these medications   amoxicillin-clavulanate 875-125 MG tablet Commonly known as:  AUGMENTIN   butalbital-acetaminophen-caffeine 50-325-40 MG tablet Commonly known as:  FIORICET, ESGIC   calcium carbonate 500 MG chewable tablet Commonly known as:  TUMS - dosed in mg elemental calcium   cyclobenzaprine 10 MG tablet Commonly known as:  FLEXERIL   FUSION PLUS Caps   hydrochlorothiazide 12.5 MG tablet Commonly known as:  HYDRODIURIL   ibuprofen 800 MG tablet Commonly known as:  ADVIL,MOTRIN   levothyroxine 75 MCG tablet Commonly known as:  SYNTHROID, LEVOTHROID   medroxyPROGESTERone 150 MG/ML injection Commonly known as:  DEPO-PROVERA   naproxen 500 MG tablet Commonly known as:  NAPROSYN    nitrofurantoin (macrocrystal-monohydrate) 100 MG capsule Commonly known as:  MACROBID   oxyCODONE-acetaminophen 5-325 MG tablet Commonly known as:  PERCOCET/ROXICET   terazosin 5 MG capsule Commonly known as:  HYTRIN   valsartan-hydrochlorothiazide 160-12.5 MG tablet Commonly known as:  DIOVAN-HCT   VITAFOL ULTRA 29-0.6-0.4-200 MG Caps     TAKE these medications   acetaminophen 325 MG tablet Commonly known as:  TYLENOL Take 2 tablets (650 mg total) by mouth every 4 (four) hours as needed (for pain scale < 4  OR  temperature  >/=  100.5 F).      Donette Larry, CNM 07/13/2017, 3:36 PM
